# Patient Record
Sex: Male | Born: 1950 | Race: White | Hispanic: No | Marital: Married | Smoking: Never smoker
Health system: Southern US, Community
[De-identification: ages and names within clinical notes are randomized; demographics above are authoritative.]

## PROBLEM LIST (undated history)

## (undated) DIAGNOSIS — Z8719 Personal history of other diseases of the digestive system: Secondary | ICD-10-CM

## (undated) DIAGNOSIS — K529 Noninfective gastroenteritis and colitis, unspecified: Secondary | ICD-10-CM

## (undated) DIAGNOSIS — C44212 Basal cell carcinoma of skin of right ear and external auricular canal: Secondary | ICD-10-CM

---

## 2000-12-31 ENCOUNTER — Other Ambulatory Visit: Admission: RE | Admit: 2000-12-31 | Discharge: 2000-12-31 | Payer: Self-pay | Admitting: Gastroenterology

## 2000-12-31 ENCOUNTER — Encounter (INDEPENDENT_AMBULATORY_CARE_PROVIDER_SITE_OTHER): Payer: Self-pay | Admitting: Specialist

## 2005-02-01 ENCOUNTER — Ambulatory Visit: Payer: Self-pay | Admitting: Gastroenterology

## 2005-02-18 ENCOUNTER — Ambulatory Visit: Payer: Self-pay | Admitting: Gastroenterology

## 2005-02-18 ENCOUNTER — Encounter (INDEPENDENT_AMBULATORY_CARE_PROVIDER_SITE_OTHER): Payer: Self-pay | Admitting: Specialist

## 2006-02-14 ENCOUNTER — Ambulatory Visit: Payer: Self-pay | Admitting: Gastroenterology

## 2006-02-24 ENCOUNTER — Ambulatory Visit: Payer: Self-pay | Admitting: Gastroenterology

## 2006-03-27 ENCOUNTER — Ambulatory Visit: Payer: Self-pay | Admitting: Gastroenterology

## 2009-10-05 ENCOUNTER — Telehealth (INDEPENDENT_AMBULATORY_CARE_PROVIDER_SITE_OTHER): Payer: Self-pay | Admitting: *Deleted

## 2010-08-14 NOTE — Progress Notes (Signed)
  Phone Note Other Incoming   Request: Send information Summary of Call: Request received from [facility] forwarded to [facility].

## 2010-11-30 NOTE — Assessment & Plan Note (Signed)
[facility]                           GASTROENTEROLOGY OFFICE NOTE   NAME:Stroope, HILERY WINTLE                       MRN:          [id number]  DATE:02/14/2006                            DOB:          08/25/1950    HISTORY:  Khayree has had some asymptomatic rectal bleeding for the last few  days without abdominal pain or other systemic complaints.  He has known  chronic ulcerative colitis with the last colonoscopy exam one year ago which  was fairly unremarkable.  Biopsy showed no evidence of dysplasia.   I am going ahead and set him up for a follow-up colonoscopy exam because of  some atypia seen on biopsy several years ago.  Restarted Canasa 1 gm  suppository at bedtime.  I have also sent him by the laboratory to check  screening laboratory parameters.                                   Vania Rea. Jarold Motto, MD, Clementeen [place], [place]   DRP/MedQ  DD:  02/14/2006  DT:  02/14/2006  Job #:  [id number]

## 2010-11-30 NOTE — Assessment & Plan Note (Signed)
[facility]                           GASTROENTEROLOGY OFFICE NOTE   NAME:Joseph David, Joseph David                       MRN:          [id number]  DATE:03/27/2006                            DOB:          Jan 09, 1951    Kyreese's colon exam on February 24, 2006, was unremarkable except for some very  mild proctitis.  He used Canasa 1000 mg suppositories for 2 weeks.  He is  now asymptomatic and does not want to be on chronic aminosalicylate  prophylactic therapy, he is only having a flare less than once a year.  I  have consulted him about the use of suppositories and perhaps the benefit of  every three day suppositories and he will consider this.  He has not had  pancolonic disease but will need colonoscopy followup again in three years'  time.                                   Vania Rea. Jarold Motto, MD, [place]een [place], [place]   DRP/MedQ  DD:  03/27/2006  DT:  03/27/2006  Job #:  [id number]

## 2019-11-25 ENCOUNTER — Other Ambulatory Visit: Payer: Self-pay

## 2019-11-25 ENCOUNTER — Observation Stay: Payer: Medicare Other

## 2019-11-25 ENCOUNTER — Observation Stay
Admission: EM | Admit: 2019-11-25 | Discharge: 2019-11-26 | Disposition: A | Discharge status: Home / Self Care | Payer: Medicare Other | Attending: Internal Medicine | Admitting: Internal Medicine

## 2019-11-25 ENCOUNTER — Emergency Department: Payer: Medicare Other

## 2019-11-25 ENCOUNTER — Encounter: Payer: Self-pay | Admitting: Emergency Medicine

## 2019-11-25 ENCOUNTER — Observation Stay: Payer: Medicare Other | Admitting: Anesthesiology

## 2019-11-25 ENCOUNTER — Encounter
Admission: EM | Disposition: A | Discharge status: Home / Self Care | Payer: Self-pay | Source: Home / Self Care | Attending: Emergency Medicine

## 2019-11-25 DIAGNOSIS — K8050 Calculus of bile duct without cholangitis or cholecystitis without obstruction: Secondary | ICD-10-CM

## 2019-11-25 DIAGNOSIS — Z20822 Contact with and (suspected) exposure to covid-19: Secondary | ICD-10-CM | POA: Insufficient documentation

## 2019-11-25 DIAGNOSIS — K8051 Calculus of bile duct without cholangitis or cholecystitis with obstruction: Secondary | ICD-10-CM | POA: Diagnosis not present

## 2019-11-25 DIAGNOSIS — R748 Abnormal levels of other serum enzymes: Secondary | ICD-10-CM | POA: Diagnosis present

## 2019-11-25 DIAGNOSIS — R079 Chest pain, unspecified: Secondary | ICD-10-CM

## 2019-11-25 DIAGNOSIS — R7401 Elevation of levels of liver transaminase levels: Secondary | ICD-10-CM | POA: Insufficient documentation

## 2019-11-25 DIAGNOSIS — R1011 Right upper quadrant pain: Secondary | ICD-10-CM | POA: Insufficient documentation

## 2019-11-25 DIAGNOSIS — K8012 Calculus of gallbladder with acute and chronic cholecystitis without obstruction: Secondary | ICD-10-CM | POA: Diagnosis not present

## 2019-11-25 DIAGNOSIS — K810 Acute cholecystitis: Secondary | ICD-10-CM | POA: Diagnosis present

## 2019-11-25 HISTORY — PX: CHOLECYSTECTOMY: SHX55

## 2019-11-25 LAB — CBC WITH DIFFERENTIAL/PLATELET
Abs Immature Granulocytes: 0.01 10*3/uL (ref 0.00–0.07)
Basophils Absolute: 0 10*3/uL (ref 0.0–0.1)
Basophils Relative: 1 %
Eosinophils Absolute: 0.1 10*3/uL (ref 0.0–0.5)
Eosinophils Relative: 1 %
HCT: 46.4 % (ref 39.0–52.0)
Hemoglobin: 16.2 g/dL (ref 13.0–17.0)
Immature Granulocytes: 0 %
Lymphocytes Relative: 21 %
Lymphs Abs: 1.5 10*3/uL (ref 0.7–4.0)
MCH: 32.9 pg (ref 26.0–34.0)
MCHC: 34.9 g/dL (ref 30.0–36.0)
MCV: 94.1 fL (ref 80.0–100.0)
Monocytes Absolute: 0.7 10*3/uL (ref 0.1–1.0)
Monocytes Relative: 9 %
Neutro Abs: 5.1 10*3/uL (ref 1.7–7.7)
Neutrophils Relative %: 68 %
Platelets: 210 10*3/uL (ref 150–400)
RBC: 4.93 MIL/uL (ref 4.22–5.81)
RDW: 12.1 % (ref 11.5–15.5)
WBC: 7.4 10*3/uL (ref 4.0–10.5)
nRBC: 0 % (ref 0.0–0.2)

## 2019-11-25 LAB — COMPREHENSIVE METABOLIC PANEL
ALT: 246 U/L — ABNORMAL HIGH (ref 0–44)
AST: 420 U/L — ABNORMAL HIGH (ref 15–41)
Albumin: 4 g/dL (ref 3.5–5.0)
Alkaline Phosphatase: 115 U/L (ref 38–126)
Anion gap: 8 (ref 5–15)
BUN: 15 mg/dL (ref 8–23)
CO2: 25 mmol/L (ref 22–32)
Calcium: 9 mg/dL (ref 8.9–10.3)
Chloride: 104 mmol/L (ref 98–111)
Creatinine, Ser: 1 mg/dL (ref 0.61–1.24)
GFR calc Af Amer: >60 mL/min (ref >60)
GFR calc non Af Amer: >60 mL/min (ref >60)
Glucose, Bld: 172 mg/dL — ABNORMAL HIGH (ref 70–99)
Potassium: 3.7 mmol/L (ref 3.5–5.1)
Sodium: 137 mmol/L (ref 135–145)
Total Bilirubin: 1.3 mg/dL — ABNORMAL HIGH (ref 0.3–1.2)
Total Protein: 6.5 g/dL (ref 6.5–8.1)

## 2019-11-25 LAB — HIV ANTIBODY (ROUTINE TESTING W REFLEX): HIV Screen 4th Generation wRfx: NONREACTIVE

## 2019-11-25 LAB — TROPONIN I (HIGH SENSITIVITY)
Troponin I (High Sensitivity): 4 ng/L (ref <18)
Troponin I (High Sensitivity): 4 ng/L (ref <18)

## 2019-11-25 LAB — SARS CORONAVIRUS 2 BY RT PCR (HOSPITAL ORDER, PERFORMED IN ~~LOC~~ HOSPITAL LAB): SARS Coronavirus 2: NEGATIVE

## 2019-11-25 LAB — LIPASE, BLOOD: Lipase: 29 U/L (ref 11–51)

## 2019-11-25 SURGERY — CHOLECYSTECTOMY, ROBOT-ASSISTED, LAPAROSCOPIC
Anesthesia: General | Site: Abdomen

## 2019-11-25 MED ORDER — BUPIVACAINE HCL (PF) 0.25 % IJ SOLN
INTRAMUSCULAR | Status: AC
  Filled 2019-11-25: qty 30

## 2019-11-25 MED ORDER — IOHEXOL 350 MG/ML SOLN
125.0000 mL | Freq: Once | INTRAVENOUS | Status: AC | PRN
  Administered 2019-11-25: 125 mL via INTRAVENOUS

## 2019-11-25 MED ORDER — SUGAMMADEX SODIUM 200 MG/2ML IV SOLN
INTRAVENOUS | Status: DC | PRN
  Administered 2019-11-25: 200 mg via INTRAVENOUS

## 2019-11-25 MED ORDER — LIDOCAINE HCL (CARDIAC) PF 100 MG/5ML IV SOSY
PREFILLED_SYRINGE | INTRAVENOUS | Status: DC | PRN
  Administered 2019-11-25: 100 mg via INTRAVENOUS

## 2019-11-25 MED ORDER — ONDANSETRON HCL 4 MG/2ML IJ SOLN: 4.0000 mg | Freq: Once | INTRAMUSCULAR | Status: DC

## 2019-11-25 MED ORDER — KETOROLAC TROMETHAMINE 15 MG/ML IJ SOLN
15.0000 mg | Freq: Four times a day (QID) | INTRAMUSCULAR | Status: DC
  Administered 2019-11-26 (×2): 15 mg via INTRAVENOUS
  Filled 2019-11-25 (×2): qty 1

## 2019-11-25 MED ORDER — SODIUM CHLORIDE 0.9 % IV SOLN
INTRAVENOUS | Status: AC
  Filled 2019-11-25: qty 2

## 2019-11-25 MED ORDER — FENTANYL CITRATE (PF) 100 MCG/2ML IJ SOLN: 25.0000 ug | INTRAMUSCULAR | Status: DC | PRN

## 2019-11-25 MED ORDER — LACTATED RINGERS IV SOLN: INTRAVENOUS | Status: DC

## 2019-11-25 MED ORDER — SODIUM CHLORIDE 0.9 % IV SOLN
INTRAVENOUS | Status: DC | PRN
  Administered 2019-11-25: 10 ug/min via INTRAVENOUS

## 2019-11-25 MED ORDER — SUCCINYLCHOLINE CHLORIDE 20 MG/ML IJ SOLN
INTRAMUSCULAR | Status: DC | PRN
  Administered 2019-11-25: 100 mg via INTRAVENOUS

## 2019-11-25 MED ORDER — OXYCODONE HCL 5 MG PO TABS
5.0000 mg | ORAL_TABLET | ORAL | Status: DC | PRN
  Administered 2019-11-25 – 2019-11-26 (×2): 5 mg via ORAL
  Filled 2019-11-25 (×2): qty 1

## 2019-11-25 MED ORDER — ONDANSETRON HCL 4 MG/2ML IJ SOLN: 4.0000 mg | Freq: Once | INTRAMUSCULAR | Status: DC | PRN

## 2019-11-25 MED ORDER — ONDANSETRON HCL 4 MG/2ML IJ SOLN
INTRAMUSCULAR | Status: DC | PRN
  Administered 2019-11-25: 4 mg via INTRAVENOUS

## 2019-11-25 MED ORDER — ONDANSETRON HCL 4 MG/2ML IJ SOLN
4.0000 mg | Freq: Four times a day (QID) | INTRAMUSCULAR | Status: DC | PRN
  Administered 2019-11-25 (×2): 4 mg via INTRAVENOUS
  Filled 2019-11-25 (×2): qty 2

## 2019-11-25 MED ORDER — FENTANYL CITRATE (PF) 100 MCG/2ML IJ SOLN
INTRAMUSCULAR | Status: AC
  Filled 2019-11-25: qty 2

## 2019-11-25 MED ORDER — FENTANYL CITRATE (PF) 100 MCG/2ML IJ SOLN: 50.0000 ug | Freq: Once | INTRAMUSCULAR | Status: AC

## 2019-11-25 MED ORDER — EPINEPHRINE PF 1 MG/ML IJ SOLN
INTRAMUSCULAR | Status: AC
  Filled 2019-11-25: qty 1

## 2019-11-25 MED ORDER — PHENYLEPHRINE HCL (PRESSORS) 10 MG/ML IV SOLN
INTRAVENOUS | Status: DC | PRN
  Administered 2019-11-25 (×3): 100 ug via INTRAVENOUS
  Administered 2019-11-25: 200 ug via INTRAVENOUS
  Administered 2019-11-25: 100 ug via INTRAVENOUS

## 2019-11-25 MED ORDER — DEXAMETHASONE SODIUM PHOSPHATE 10 MG/ML IJ SOLN
INTRAMUSCULAR | Status: DC | PRN
  Administered 2019-11-25: 10 mg via INTRAVENOUS

## 2019-11-25 MED ORDER — FENTANYL CITRATE (PF) 100 MCG/2ML IJ SOLN
INTRAMUSCULAR | Status: DC | PRN
  Administered 2019-11-25: 50 ug via INTRAVENOUS

## 2019-11-25 MED ORDER — DEXAMETHASONE SODIUM PHOSPHATE 10 MG/ML IJ SOLN
INTRAMUSCULAR | Status: AC
  Filled 2019-11-25: qty 1

## 2019-11-25 MED ORDER — ROCURONIUM BROMIDE 100 MG/10ML IV SOLN
INTRAVENOUS | Status: DC | PRN
  Administered 2019-11-25: 20 mg via INTRAVENOUS
  Administered 2019-11-25: 40 mg via INTRAVENOUS
  Administered 2019-11-25: 20 mg via INTRAVENOUS
  Administered 2019-11-25: 10 mg via INTRAVENOUS

## 2019-11-25 MED ORDER — PROPOFOL 10 MG/ML IV BOLUS
INTRAVENOUS | Status: AC
  Filled 2019-11-25: qty 20

## 2019-11-25 MED ORDER — PROPOFOL 10 MG/ML IV BOLUS
INTRAVENOUS | Status: DC | PRN
  Administered 2019-11-25: 150 mg via INTRAVENOUS

## 2019-11-25 MED ORDER — PIPERACILLIN-TAZOBACTAM 3.375 G IVPB 30 MIN
3.3750 g | Freq: Once | INTRAVENOUS | Status: AC
  Administered 2019-11-25: 3.375 g via INTRAVENOUS
  Filled 2019-11-25: qty 50

## 2019-11-25 MED ORDER — ACETAMINOPHEN 10 MG/ML IV SOLN: 1000.0000 mg | Freq: Once | INTRAVENOUS | Status: DC | PRN

## 2019-11-25 MED ORDER — OXYCODONE HCL 5 MG/5ML PO SOLN: 5.0000 mg | Freq: Once | ORAL | Status: DC | PRN

## 2019-11-25 MED ORDER — MIDAZOLAM HCL 2 MG/2ML IJ SOLN
INTRAMUSCULAR | Status: DC | PRN
  Administered 2019-11-25: 2 mg via INTRAVENOUS

## 2019-11-25 MED ORDER — INDOCYANINE GREEN 25 MG IV SOLR
7.5000 mg | Freq: Once | INTRAVENOUS | Status: AC
  Administered 2019-11-25: 7.5 mg via INTRAVENOUS

## 2019-11-25 MED ORDER — EPHEDRINE SULFATE 50 MG/ML IJ SOLN
INTRAMUSCULAR | Status: DC | PRN
  Administered 2019-11-25: 10 mg via INTRAVENOUS

## 2019-11-25 MED ORDER — EPHEDRINE 5 MG/ML INJ
INTRAVENOUS | Status: AC
  Filled 2019-11-25: qty 10

## 2019-11-25 MED ORDER — BUPIVACAINE-EPINEPHRINE (PF) 0.25% -1:200000 IJ SOLN
INTRAMUSCULAR | Status: DC | PRN
  Administered 2019-11-25: 30 mL via PERINEURAL

## 2019-11-25 MED ORDER — ONDANSETRON HCL 4 MG/2ML IJ SOLN
INTRAMUSCULAR | Status: AC
  Filled 2019-11-25: qty 2

## 2019-11-25 MED ORDER — KETOROLAC TROMETHAMINE 30 MG/ML IJ SOLN
INTRAMUSCULAR | Status: DC | PRN
  Administered 2019-11-25: 30 mg via INTRAVENOUS

## 2019-11-25 MED ORDER — ONDANSETRON HCL 4 MG/2ML IJ SOLN
INTRAMUSCULAR | Status: AC
  Administered 2019-11-25: 4 mg via INTRAVENOUS
  Filled 2019-11-25: qty 2

## 2019-11-25 MED ORDER — SODIUM CHLORIDE 0.9 % IV SOLN: 2.0000 g | INTRAVENOUS | Status: DC

## 2019-11-25 MED ORDER — LACTATED RINGERS IV BOLUS
1000.0000 mL | Freq: Once | INTRAVENOUS | Status: AC
  Administered 2019-11-25: 1000 mL via INTRAVENOUS

## 2019-11-25 MED ORDER — GADOBUTROL 1 MMOL/ML IV SOLN
10.0000 mL | Freq: Once | INTRAVENOUS | Status: AC | PRN
  Administered 2019-11-25: 10 mL via INTRAVENOUS

## 2019-11-25 MED ORDER — PIPERACILLIN-TAZOBACTAM 3.375 G IVPB: 3.3750 g | Freq: Once | INTRAVENOUS | Status: DC

## 2019-11-25 MED ORDER — FENTANYL CITRATE (PF) 100 MCG/2ML IJ SOLN
INTRAMUSCULAR | Status: AC
  Administered 2019-11-25: 50 ug via INTRAVENOUS
  Filled 2019-11-25: qty 2

## 2019-11-25 MED ORDER — SUGAMMADEX SODIUM 200 MG/2ML IV SOLN: INTRAVENOUS | Status: DC | PRN

## 2019-11-25 MED ORDER — MORPHINE SULFATE (PF) 2 MG/ML IV SOLN
2.0000 mg | INTRAVENOUS | Status: DC | PRN
  Administered 2019-11-25 (×4): 2 mg via INTRAVENOUS
  Filled 2019-11-25 (×4): qty 1

## 2019-11-25 MED ORDER — MORPHINE SULFATE (PF) 4 MG/ML IV SOLN
4.0000 mg | Freq: Once | INTRAVENOUS | Status: AC
  Administered 2019-11-25: 4 mg via INTRAVENOUS
  Filled 2019-11-25: qty 1

## 2019-11-25 MED ORDER — ONDANSETRON HCL 4 MG PO TABS: 4.0000 mg | ORAL_TABLET | Freq: Four times a day (QID) | ORAL | Status: DC | PRN

## 2019-11-25 MED ORDER — MIDAZOLAM HCL 2 MG/2ML IJ SOLN
INTRAMUSCULAR | Status: AC
  Filled 2019-11-25: qty 2

## 2019-11-25 MED ORDER — FENTANYL CITRATE (PF) 100 MCG/2ML IJ SOLN
50.0000 ug | Freq: Once | INTRAMUSCULAR | Status: AC
  Administered 2019-11-25: 50 ug via INTRAVENOUS

## 2019-11-25 MED ORDER — OXYCODONE HCL 5 MG PO TABS: 5.0000 mg | ORAL_TABLET | Freq: Once | ORAL | Status: DC | PRN

## 2019-11-25 MED ORDER — SODIUM CHLORIDE 0.9 % IV SOLN
INTRAVENOUS | Status: DC | PRN
  Administered 2019-11-25: 2 g via INTRAVENOUS

## 2019-11-25 MED ORDER — INDOCYANINE GREEN 25 MG IV SOLR: 7.5000 mg | Freq: Once | INTRAVENOUS | Status: DC

## 2019-11-25 SURGICAL SUPPLY — 52 items
BULB RESERV EVAC DRAIN JP 100C (MISCELLANEOUS) ×2 IMPLANT
CANISTER SUCT 1200ML W/VALVE (MISCELLANEOUS) ×3 IMPLANT
CANNULA REDUC XI 12-8 STAPL (CANNULA) ×2
CANNULA REDUC XI 12-8MM STAPL (CANNULA) ×1
CANNULA REDUCER 12-8 DVNC XI (CANNULA) IMPLANT
CHLORAPREP W/TINT 26 (MISCELLANEOUS) ×3 IMPLANT
CLIP VESOLOCK MED LG 6/CT (CLIP) ×3 IMPLANT
COVER WAND RF STERILE (DRAPES) ×3 IMPLANT
DECANTER SPIKE VIAL GLASS SM (MISCELLANEOUS) ×3 IMPLANT
DEFOGGER SCOPE WARMER CLEARIFY (MISCELLANEOUS) ×3 IMPLANT
DERMABOND ADVANCED (GAUZE/BANDAGES/DRESSINGS) ×2
DERMABOND ADVANCED .7 DNX12 (GAUZE/BANDAGES/DRESSINGS) ×1 IMPLANT
DRAIN CHANNEL JP 19F (MISCELLANEOUS) ×2 IMPLANT
DRAPE ARM DVNC X/XI (DISPOSABLE) ×4 IMPLANT
DRAPE COLUMN DVNC XI (DISPOSABLE) ×1 IMPLANT
DRAPE DA VINCI XI ARM (DISPOSABLE) ×12
DRAPE DA VINCI XI COLUMN (DISPOSABLE) ×3
ELECT CAUTERY BLADE 6.4 (BLADE) ×3 IMPLANT
ELECT REM PT RETURN 9FT ADLT (ELECTROSURGICAL) ×3
ELECTRODE REM PT RTRN 9FT ADLT (ELECTROSURGICAL) ×1 IMPLANT
GLOVE BIO SURGEON STRL SZ7 (GLOVE) ×6 IMPLANT
GOWN STRL REUS W/ TWL LRG LVL3 (GOWN DISPOSABLE) ×4 IMPLANT
GOWN STRL REUS W/TWL LRG LVL3 (GOWN DISPOSABLE) ×12
IRRIGATION STRYKERFLOW (MISCELLANEOUS) IMPLANT
IRRIGATOR STRYKERFLOW (MISCELLANEOUS) ×3
IV NS 1000ML (IV SOLUTION) ×3
IV NS 1000ML BAXH (IV SOLUTION) IMPLANT
KIT IMAGING PINPOINTPAQ (MISCELLANEOUS) ×2 IMPLANT
KIT PINK PAD W/HEAD ARE REST (MISCELLANEOUS) ×3
KIT PINK PAD W/HEAD ARM REST (MISCELLANEOUS) ×1 IMPLANT
LABEL OR SOLS (LABEL) ×3 IMPLANT
NEEDLE HYPO 22GX1.5 SAFETY (NEEDLE) ×3 IMPLANT
NS IRRIG 500ML POUR BTL (IV SOLUTION) ×3 IMPLANT
OBTURATOR OPTICAL STANDARD 8MM (TROCAR) ×6
OBTURATOR OPTICAL STND 8 DVNC (TROCAR) ×2
OBTURATOR OPTICALSTD 8 DVNC (TROCAR) ×1 IMPLANT
PACK LAP CHOLECYSTECTOMY (MISCELLANEOUS) ×3 IMPLANT
PENCIL ELECTRO HAND CTR (MISCELLANEOUS) ×3 IMPLANT
POUCH SPECIMEN RETRIEVAL 10MM (ENDOMECHANICALS) ×3 IMPLANT
SEAL CANN UNIV 5-8 DVNC XI (MISCELLANEOUS) ×4 IMPLANT
SEAL XI 5MM-8MM UNIVERSAL (MISCELLANEOUS) ×12
SET TUBE SMOKE EVAC HIGH FLOW (TUBING) ×3 IMPLANT
SOLUTION ELECTROLUBE (MISCELLANEOUS) ×3 IMPLANT
SPONGE LAP 18X18 RF (DISPOSABLE) ×3 IMPLANT
SPONGE LAP 4X18 RFD (DISPOSABLE) ×2 IMPLANT
STAPLER CANNULA SEAL DVNC XI (STAPLE) IMPLANT
STAPLER CANNULA SEAL XI (STAPLE) ×3
SUT ETHILON 3-0 FS-10 30 BLK (SUTURE) ×3
SUT MNCRL AB 4-0 PS2 18 (SUTURE) ×3 IMPLANT
SUT VICRYL 0 AB UR-6 (SUTURE) ×6 IMPLANT
SUTURE EHLN 3-0 FS-10 30 BLK (SUTURE) IMPLANT
TROCAR 130MM GELPORT  DAV (MISCELLANEOUS) ×3 IMPLANT

## 2019-11-25 NOTE — Anesthesia Procedure Notes (Signed)
Procedure Name: Intubation Date/Time: 11/25/2019 3:23 PM Performed by: Irving Burton, CRNA Pre-anesthesia Checklist: Patient identified, Emergency Drugs available, Suction available and Patient being monitored Patient Re-evaluated:Patient Re-evaluated prior to induction Oxygen Delivery Method: Circle system utilized Preoxygenation: Pre-oxygenation with 100% oxygen Induction Type: IV induction and Rapid sequence Laryngoscope Size: McGraph and 4 Grade View: Grade I Tube type: Oral Tube size: 7.5 mm Number of attempts: 1 Airway Equipment and Method: Stylet and Video-laryngoscopy Placement Confirmation: ETT inserted through vocal cords under direct vision,  positive ETCO2 and breath sounds checked- equal and bilateral Secured at: 24 cm Tube secured with: Tape Dental Injury: Teeth and Oropharynx as per pre-operative assessment

## 2019-11-25 NOTE — ED Notes (Signed)
This RN called and updated wife of pt's unit change. OR bedside to transport pt.

## 2019-11-25 NOTE — Progress Notes (Signed)
[facility]  - [place] at [facility]   PATIENT NAME: Joseph David    MR#:  [id number]  DATE OF BIRTH:  [birth year]  SUBJECTIVE:  patient came in with acute onset right upper quadrant pain after dinner last night. Complains of nausea. Uncomfortable this morning. Requesting pain medicine. Awaiting MRCP. No fever.  REVIEW OF SYSTEMS:   Review of Systems  Constitutional: Negative for chills, fever and weight loss.  HENT: Negative for ear discharge, ear pain and nosebleeds.   Eyes: Negative for blurred vision, pain and discharge.  Respiratory: Negative for sputum production, shortness of breath, wheezing and stridor.   Cardiovascular: Negative for chest pain, palpitations, orthopnea and PND.  Gastrointestinal: Positive for abdominal pain and nausea. Negative for diarrhea and vomiting.  Genitourinary: Negative for frequency and urgency.  Musculoskeletal: Negative for back pain and joint pain.  Neurological: Negative for sensory change, speech change, focal weakness and weakness.  Psychiatric/Behavioral: Negative for depression and hallucinations. The patient is not nervous/anxious.    Tolerating Diet:npo Tolerating PT: pt is independent  DRUG ALLERGIES:  No Known Allergies  VITALS:  Blood pressure 131/81, pulse 65, temperature 98 F (36.7 C), temperature source Oral, resp. rate 13, height 6\' 1"  (1.854 m), weight 106.6 kg, SpO2 100 %.  PHYSICAL EXAMINATION:   Physical Exam  GENERAL:  69 y.o.-year-old patient lying in the bed with no acute distress.  EYES: Pupils equal, round, reactive to light and accommodation. No scleral icterus.   HEENT: Head atraumatic, normocephalic. Oropharynx and nasopharynx clear.  NECK:  Supple, no jugular venous distention. No thyroid enlargement, no tenderness.  LUNGS: Normal breath sounds bilaterally, no wheezing, rales, rhonchi. No use of accessory muscles of respiration.  CARDIOVASCULAR: S1, S2 normal. No murmurs, rubs, or gallops.   ABDOMEN: Soft, right UQ tender, nondistended. Bowel sounds present. No organomegaly or mass.  EXTREMITIES: No cyanosis, clubbing or edema b/l.    NEUROLOGIC: Cranial nerves II through XII are intact. No focal Motor or sensory deficits b/l.   PSYCHIATRIC:  patient is alert and oriented x 3.  SKIN: No obvious rash, lesion, or ulcer.   LABORATORY PANEL:  CBC Recent Labs  Lab 11/25/19 0224  WBC 7.4  HGB 16.2  HCT 46.4  PLT 210    Chemistries  Recent Labs  Lab 11/25/19 0224  NA 137  K 3.7  CL 104  CO2 25  GLUCOSE 172*  BUN 15  CREATININE 1.00  CALCIUM 9.0  AST 420*  ALT 246*  ALKPHOS 115  BILITOT 1.3*   Cardiac Enzymes No results for input(s): TROPONINI in the last 168 hours. RADIOLOGY:  DG Chest Port 1 View  Result Date: 11/25/2019 CLINICAL DATA:  Indigestion slow breathing EXAM: PORTABLE CHEST 1 VIEW COMPARISON:  None. FINDINGS: The heart size and mediastinal contours are within normal limits. Both lungs are clear. The visualized skeletal structures are unremarkable. IMPRESSION: No active disease. Electronically Signed   By: 11/27/2019 M.D.   On: 11/25/2019 02:37   CT Angio Chest/Abd/Pel for Dissection W and/or Wo Contrast  Result Date: 11/25/2019 CLINICAL DATA:  Acute chest and upper abdominal pain with nausea. EXAM: CT ANGIOGRAPHY CHEST, ABDOMEN AND PELVIS TECHNIQUE: Non-contrast CT of the chest was initially obtained. Multidetector CT imaging through the chest, abdomen and pelvis was performed using the standard protocol during bolus administration of intravenous contrast. Multiplanar reconstructed images and MIPs were obtained and reviewed to evaluate the vascular anatomy. CONTRAST:  11/27/2019 OMNIPAQUE IOHEXOL 350 MG/ML SOLN COMPARISON:  Right upper  quadrant ultrasound mid chest x-ray from same day. FINDINGS: CTA CHEST FINDINGS Cardiovascular: Preferential opacification of the thoracic aorta. No evidence of thoracic aortic aneurysm or dissection. Normal heart size. No  pericardial effusion. No central pulmonary embolism. Mediastinum/Nodes: No enlarged mediastinal, hilar, or axillary lymph nodes. Thyroid gland, trachea, and esophagus demonstrate no significant findings. Lungs/Pleura: Lungs are clear. No pleural effusion or pneumothorax. Musculoskeletal: No chest wall abnormality. No acute or significant osseous findings. Review of the MIP images confirms the above findings. CTA ABDOMEN AND PELVIS FINDINGS VASCULAR Aorta: Normal caliber aorta without aneurysm, dissection, vasculitis or significant stenosis. Celiac: Patent without evidence of aneurysm, dissection, vasculitis or significant stenosis. SMA: Patent without evidence of aneurysm, dissection, vasculitis or significant stenosis. Renals: Both renal arteries are patent without evidence of aneurysm, dissection, vasculitis, fibromuscular dysplasia or significant stenosis. IMA: Patent without evidence of aneurysm, dissection, vasculitis or significant stenosis. Inflow: Patent without evidence of aneurysm, dissection, vasculitis or significant stenosis. Veins: No obvious venous abnormality within the limitations of this arterial phase study. Review of the MIP images confirms the above findings. NON-VASCULAR Hepatobiliary: No focal liver abnormality is seen. No gallstones or gallbladder wall thickening. Mildly dilated common bile duct measuring 9-10 mm. Pancreas: Unremarkable. No pancreatic ductal dilatation or surrounding inflammatory changes. Spleen: Normal in size without focal abnormality. Adrenals/Urinary Tract: Adrenal glands are unremarkable. Kidneys are normal, without renal calculi, focal lesion, or hydronephrosis. Bladder is unremarkable. Stomach/Bowel: Small hiatal hernia. The stomach is otherwise within normal limits. No bowel wall thickening, distention, or surrounding inflammatory changes. Normal appendix. Lymphatic: No enlarged abdominal or pelvic lymph nodes. Reproductive: Mild prostatomegaly. Other: No abdominal  wall hernia or abnormality. No abdominopelvic ascites. No pneumoperitoneum. Musculoskeletal: No acute or significant osseous findings. Review of the MIP images confirms the above findings. IMPRESSION: 1. No evidence of acute aortic syndrome or thoracoabdominal aortic aneurysm. 2. Mildly dilated common bile duct measuring 9-10 mm. Given clinical history and mildly elevated LFTs, further evaluation with ERCP or MRCP is recommended. Electronically Signed   By: Obie Dredge M.D.   On: 11/25/2019 05:12   US ABDOMEN LIMITED RUQ  Result Date: 11/25/2019 CLINICAL DATA:  Right upper quadrant pain EXAM: ULTRASOUND ABDOMEN LIMITED RIGHT UPPER QUADRANT COMPARISON:  None. FINDINGS: Gallbladder: Numerous layering calcified gallstones and sludge are seen. The largest measuring 9 mm. No gallbladder wall thickening is noted. No sonographic Murphy sign noted by sonographer. Common bile duct: Diameter: 6 mm Liver: No focal lesion identified. Within normal limits in parenchymal echogenicity. Portal vein is patent on color Doppler imaging with normal direction of blood flow towards the liver. Other: None. IMPRESSION: Cholelithiasis and layering sludge. No definite evidence of acute cholecystitis. Electronically Signed   By: Jonna Clark M.D.   On: 11/25/2019 03:40   ASSESSMENT AND PLAN:   RUPERT AZZARA is a 69 y.o. male with no significant past medical history who presents to the emergency room with right upper quadrant pain that started after having dinner last night and progressed throughout the night.  Describes the pain as severe and crampy, radiating to the back.  He tried over-the-counter medication for indigestion like Tums without relief.  It is associated with nausea but without vomiting.  Abdominal pain right upper quadrant with nausea likely due to gallstones and possible choledocholithiasis without cholecystitis -Patient presents with persistent right upper quadrant pain, with elevated LFTs, normal lipase,  mildly dilated common bile duct (on CT abdomen) -bilirubin 1.3 -MRCP ordered pending since morning - Abdominal ultrasound showing layering gallstones and  sludge. No murphy's sign --surgical consultation with Dr. Perrin Maltese appreciated. --Keep n.p.o. -Pain control with IV and po meds -Patient received an empiric dose of Zosyn in the emergency room. -no fever, wbc normal--hold abxs  Elevated transaminases suspected due to GB dz -pt drinks alcohol only socially.    DVT prophylaxis: SCDs pending procedure decision Code Status: full code  Family Communication:  none  Disposition Plan: Back to previous home environment Consults called: dr Perrin Maltese Status:obs   TOTAL TIME TAKING CARE OF THIS PATIENT: *35* minutes.  >50% time spent on counselling and coordination of care  Note: This dictation was prepared with Dragon dictation along with smaller phrase technology. Any transcriptional errors that result from this process are unintentional.  Fritzi Mandes M.D    [facility]   CC: Primary care physician; [place] Pink, MDPatient ID: Madaline Savage, male   DOB: 03-Dec-1950, 69 y.o.   MRN: [id number]

## 2019-11-25 NOTE — H&P (Signed)
History and Physical    DANNELL GORTNEY TDD:[id number] DOB: [birth year] DOA: 11/25/2019  PCP: Maryland Pink, MD   Patient coming from: Home I have personally briefly reviewed patient's old medical records in [place]  Chief Complaint: Abdominal pain times hours  HPI: Joseph David is a 69 y.o. male with no significant past medical history who presents to the emergency room with right upper quadrant pain that started after having dinner last night and progressed throughout the night.  Describes the pain as severe and crampy, radiating to the back.  N tried over-the-counter medication for indigestion like Tums without relief.  It is associated with nausea but without vomiting.  He denies chest pain or shortness of breath  ED Course: In the emergency room he was initially hypotensive at 88/48 with otherwise normal vitals.  His blood pressure improved to 119/44 with IV hydration.  WBC normal at 7400.  Elevated LFTs with AST 420 and ALT 246 and total bilirubin 1.3.  RUQ sonogram showed cholelithiasis with layering calcified gallstones sludge no definite evidence of acute cholecystitis.  Negative Murphy sign and no gallbladder wall thickening.  He subsequently had a CT angio abdomen and pelvis that showed mildly dilated common bile duct measuring 9 to 10 mm with recommendation for further evaluation with ERCP or MRCP.  Emergency room provider spoke with Dr. Alice Reichert who recommended to consult GI, Dr. Allen Norris for consideration of ERCP  Review of Systems: As per HPI otherwise 10 point review of systems negative.    History reviewed. No pertinent past medical history.  History reviewed. No pertinent surgical history.   reports that he has never smoked. He has never used smokeless tobacco. No history on file for alcohol and drug.  No Known Allergies  No pertinent past family history   Prior to Admission medications   Not on File    Physical Exam: Vitals:   11/25/19 0330 11/25/19 0400  11/25/19 0515 11/25/19 0530  BP: (!) 142/86 (!) 134/92  (!) 119/44  Pulse: (!) 57 (!) 53 62   Resp: 14 19 17 15   Temp:      TempSrc:      SpO2: 100% 97% 94%   Weight:      Height:         Vitals:   11/25/19 0330 11/25/19 0400 11/25/19 0515 11/25/19 0530  BP: (!) 142/86 (!) 134/92  (!) 119/44  Pulse: (!) 57 (!) 53 62   Resp: 14 19 17 15   Temp:      TempSrc:      SpO2: 100% 97% 94%   Weight:      Height:        Constitutional: Alert and awake, oriented x3, not in any acute distress. Eyes: PERLA, EOMI, irises appear normal, anicteric sclera,  ENMT: external ears and nose appear normal, normal hearing             Lips appears normal, oropharynx mucosa, tongue, posterior pharynx appear normal  Neck: neck appears normal, no masses, normal ROM, no thyromegaly, no JVD  CVS: S1-S2 clear, no murmur rubs or gallops,  , no carotid bruits, pedal pulses palpable, No LE edema Respiratory:  clear to auscultation bilaterally, no wheezing, rales or rhonchi. Respiratory effort normal. No accessory muscle use.  Abdomen: Tender in right upper quadrant, nondistended, normal bowel sounds, no hepatosplenomegaly, no hernias Musculoskeletal: : no cyanosis, clubbing , no contractures or atrophy Neuro: Cranial nerves II-XII intact, sensation, reflexes normal, strength Psych: judgement and insight  appear normal, stable mood and affect,  Skin: no rashes or lesions or ulcers, no induration or nodules   Labs on Admission: I have personally reviewed following labs and imaging studies  CBC: Recent Labs  Lab 11/25/19 0224  WBC 7.4  NEUTROABS 5.1  HGB 16.2  HCT 46.4  MCV 94.1  PLT 210   Basic Metabolic Panel: Recent Labs  Lab 11/25/19 0224  NA 137  K 3.7  CL 104  CO2 25  GLUCOSE 172*  BUN 15  CREATININE 1.00  CALCIUM 9.0   GFR: Estimated Creatinine Clearance: 91.9 mL/min (by C-G formula based on SCr of 1 mg/dL). Liver Function Tests: Recent Labs  Lab 11/25/19 0224  AST 420*  ALT  246*  ALKPHOS 115  BILITOT 1.3*  PROT 6.5  ALBUMIN 4.0   Recent Labs  Lab 11/25/19 0224  LIPASE 29   No results for input(s): AMMONIA in the last 168 hours. Coagulation Profile: No results for input(s): INR, PROTIME in the last 168 hours. Cardiac Enzymes: No results for input(s): CKTOTAL, CKMB, CKMBINDEX, TROPONINI in the last 168 hours. BNP (last 3 results) No results for input(s): PROBNP in the last 8760 hours. HbA1C: No results for input(s): HGBA1C in the last 72 hours. CBG: No results for input(s): GLUCAP in the last 168 hours. Lipid Profile: No results for input(s): CHOL, HDL, LDLCALC, TRIG, CHOLHDL, LDLDIRECT in the last 72 hours. Thyroid Function Tests: No results for input(s): TSH, T4TOTAL, FREET4, T3FREE, THYROIDAB in the last 72 hours. Anemia Panel: No results for input(s): VITAMINB12, FOLATE, FERRITIN, TIBC, IRON, RETICCTPCT in the last 72 hours. Urine analysis: No results found for: COLORURINE, APPEARANCEUR, LABSPEC, PHURINE, GLUCOSEU, HGBUR, BILIRUBINUR, KETONESUR, PROTEINUR, UROBILINOGEN, NITRITE, LEUKOCYTESUR  Radiological Exams on Admission: DG Chest Port 1 View  Result Date: 11/25/2019 CLINICAL DATA:  Indigestion slow breathing EXAM: PORTABLE CHEST 1 VIEW COMPARISON:  None. FINDINGS: The heart size and mediastinal contours are within normal limits. Both lungs are clear. The visualized skeletal structures are unremarkable. IMPRESSION: No active disease. Electronically Signed   By: Jonna Clark M.D.   On: 11/25/2019 02:37   CT Angio Chest/Abd/Pel for Dissection W and/or Wo Contrast  Result Date: 11/25/2019 CLINICAL DATA:  Acute chest and upper abdominal pain with nausea. EXAM: CT ANGIOGRAPHY CHEST, ABDOMEN AND PELVIS TECHNIQUE: Non-contrast CT of the chest was initially obtained. Multidetector CT imaging through the chest, abdomen and pelvis was performed using the standard protocol during bolus administration of intravenous contrast. Multiplanar reconstructed  images and MIPs were obtained and reviewed to evaluate the vascular anatomy. CONTRAST:  OMNIPAQUE IOHEXOL 350 MG/ML SOLN COMPARISON:  Right upper quadrant ultrasound mid chest x-ray from same day. FINDINGS: CTA CHEST FINDINGS Cardiovascular: Preferential opacification of the thoracic aorta. No evidence of thoracic aortic aneurysm or dissection. Normal heart size. No pericardial effusion. No central pulmonary embolism. Mediastinum/Nodes: No enlarged mediastinal, hilar, or axillary lymph nodes. Thyroid gland, trachea, and esophagus demonstrate no significant findings. Lungs/Pleura: Lungs are clear. No pleural effusion or pneumothorax. Musculoskeletal: No chest wall abnormality. No acute or significant osseous findings. Review of the MIP images confirms the above findings. CTA ABDOMEN AND PELVIS FINDINGS VASCULAR Aorta: Normal caliber aorta without aneurysm, dissection, vasculitis or significant stenosis. Celiac: Patent without evidence of aneurysm, dissection, vasculitis or significant stenosis. SMA: Patent without evidence of aneurysm, dissection, vasculitis or significant stenosis. Renals: Both renal arteries are patent without evidence of aneurysm, dissection, vasculitis, fibromuscular dysplasia or significant stenosis. IMA: Patent without evidence of aneurysm, dissection, vasculitis or  significant stenosis. Inflow: Patent without evidence of aneurysm, dissection, vasculitis or significant stenosis. Veins: No obvious venous abnormality within the limitations of this arterial phase study. Review of the MIP images confirms the above findings. NON-VASCULAR Hepatobiliary: No focal liver abnormality is seen. No gallstones or gallbladder wall thickening. Mildly dilated common bile duct measuring 9-10 mm. Pancreas: Unremarkable. No pancreatic ductal dilatation or surrounding inflammatory changes. Spleen: Normal in size without focal abnormality. Adrenals/Urinary Tract: Adrenal glands are unremarkable. Kidneys are  normal, without renal calculi, focal lesion, or hydronephrosis. Bladder is unremarkable. Stomach/Bowel: Small hiatal hernia. The stomach is otherwise within normal limits. No bowel wall thickening, distention, or surrounding inflammatory changes. Normal appendix. Lymphatic: No enlarged abdominal or pelvic lymph nodes. Reproductive: Mild prostatomegaly. Other: No abdominal wall hernia or abnormality. No abdominopelvic ascites. No pneumoperitoneum. Musculoskeletal: No acute or significant osseous findings. Review of the MIP images confirms the above findings. IMPRESSION: 1. No evidence of acute aortic syndrome or thoracoabdominal aortic aneurysm. 2. Mildly dilated common bile duct measuring 9-10 mm. Given clinical history and mildly elevated LFTs, further evaluation with ERCP or MRCP is recommended. Electronically Signed   By: Obie Dredge M.D.   On: 11/25/2019 05:12   US ABDOMEN LIMITED RUQ  Result Date: 11/25/2019 CLINICAL DATA:  Right upper quadrant pain EXAM: ULTRASOUND ABDOMEN LIMITED RIGHT UPPER QUADRANT COMPARISON:  None. FINDINGS: Gallbladder: Numerous layering calcified gallstones and sludge are seen. The largest measuring 9 mm. No gallbladder wall thickening is noted. No sonographic Murphy sign noted by sonographer. Common bile duct: Diameter: 6 mm Liver: No focal lesion identified. Within normal limits in parenchymal echogenicity. Portal vein is patent on color Doppler imaging with normal direction of blood flow towards the liver. Other: None. IMPRESSION: Cholelithiasis and layering sludge. No definite evidence of acute cholecystitis. Electronically Signed   By: Jonna Clark M.D.   On: 11/25/2019 03:40    EKG: Independently reviewed.   Assessment/Plan Principal Problem:    Choledocholithiasis without cholecystitis -Patient presents with persistent right upper quadrant pain, with elevated LFTs, normal lipase, mildly dilated common bile duct with recommendation for ERCP or MRCP -.  Abdominal  ultrasound showing layering gallstones and sludge. -Dr Servando Snare consulted for consideration of ERCP on the advice of Dr. Norma Fredrickson -Keep n.p.o. -Pain control -Patient received an empiric dose of Zosyn in the emergency room.    DVT prophylaxis: SCDs pending procedure decision Code Status: full code  Family Communication:  none  Disposition Plan: Back to previous home environment Consults called: Dr. Servando Snare Status:obs    Andris Baumann MD [facility]     11/25/2019, 5:51 AM

## 2019-11-25 NOTE — ED Notes (Signed)
Pt visualized resting, pain decreased after morphine administration. Urinal emptied orf , amber colored urine.

## 2019-11-25 NOTE — Anesthesia Preprocedure Evaluation (Signed)
Anesthesia Evaluation  Patient identified by MRN, date of birth, ID band Patient awake    Reviewed: Allergy & Precautions, NPO status , Patient's Chart, lab work & pertinent test results  History of Anesthesia Complications Negative for: history of anesthetic complications  Airway Mallampati: II  TM Distance: >3 FB Neck ROM: Full    Dental no notable dental hx. (+) Teeth Intact, Dental Advisory Given   Pulmonary neg pulmonary ROS, neg sleep apnea, neg COPD, Patient abstained from smoking.Not current smoker,    Pulmonary exam normal breath sounds clear to auscultation       Cardiovascular Exercise Tolerance: Good METS(-) hypertension(-) CAD and (-) Past MI negative cardio ROS  (-) dysrhythmias  Rhythm:Regular Rate:Normal - Systolic murmurs    Neuro/Psych negative neurological ROS  negative psych ROS   GI/Hepatic neg GERD  ,(+)     (-) substance abuse  ,   Endo/Other  neg diabetes  Renal/GU negative Renal ROS     Musculoskeletal   Abdominal   Peds  Hematology   Anesthesia Other Findings History reviewed. No pertinent past medical history.  Reproductive/Obstetrics                             Anesthesia Physical Anesthesia Plan  ASA: II  Anesthesia Plan: General   Post-op Pain Management:    Induction: Intravenous, Rapid sequence and Cricoid pressure planned  PONV Risk Score and Plan: 3 and Ondansetron and Dexamethasone  Airway Management Planned: Oral ETT  Additional Equipment: None  Intra-op Plan:   Post-operative Plan: Extubation in OR  Informed Consent: I have reviewed the patients History and Physical, chart, labs and discussed the procedure including the risks, benefits and alternatives for the proposed anesthesia with the patient or authorized representative who has indicated his/her understanding and acceptance.     Dental advisory given  Plan Discussed with:  CRNA and Surgeon  Anesthesia Plan Comments: (Patient has on and off nausea, denies vomiting or retching.   Discussed risks of anesthesia with patient, including PONV, sore throat, lip/dental damage. Rare risks discussed as well, such as cardiorespiratory and neurological sequelae. Patient understands.)        Anesthesia Quick Evaluation

## 2019-11-25 NOTE — Transfer of Care (Signed)
Immediate Anesthesia Transfer of Care Note  Patient: Joseph David  Procedure(s) Performed: XI ROBOTIC ASSISTED LAPAROSCOPIC CHOLECYSTECTOMY (N/A Abdomen)  Patient Location: PACU  Anesthesia Type:General  Level of Consciousness: sedated  Airway & Oxygen Therapy: Patient connected to face mask oxygen  Post-op Assessment: Post -op Vital signs reviewed and stable  Post vital signs: stable  Last Vitals:  Vitals Value Taken Time  BP 142/75 11/25/19 1731  Temp    Pulse 85 11/25/19 1731  Resp 15 11/25/19 1731  SpO2 100 % 11/25/19 1731  Vitals shown include unvalidated device data.  Last Pain:  Vitals:   11/25/19 1400  TempSrc: Temporal  PainSc: 6          Complications: No apparent anesthesia complications

## 2019-11-25 NOTE — ED Notes (Addendum)
MRI called per Dr. Allena Katz. MRI waiting on day shift to come screen pt prior to procedure.   3838: MRI bedside for pt screening.

## 2019-11-25 NOTE — Progress Notes (Signed)
Patient ID: Joseph David, male   DOB: 1951/02/14, 69 y.o.   MRN: [id number]  MRCP results noted. Patient is going to the OR for lap cholecystectomy by Dr. Valaria Good antibiotics are ordered by surgery.

## 2019-11-25 NOTE — ED Triage Notes (Addendum)
Pt to triage via w/c, mask in place; pt holding head down, moaning, st he is unable to even pick his arms up because he feels like he is going to pass out; pt st since last night having "indigestion" unrelieved by tums; now with pain to upper abd and lower chest accomp by nausea; pt st he feels like he is going to pass out and needs to lay down; pt is hyperventilating; encouraged to slow breathing; pt since since 1am has been feeling worse with pain and weakness

## 2019-11-25 NOTE — ED Provider Notes (Signed)
[facility] Emergency Department Provider Note  ____________________________________________  Time seen: Approximately 2:50 AM  I have reviewed the triage vital signs and the nursing notes.   HISTORY  Chief Complaint Chest Pain   HPI Joseph David is a 69 y.o. male no significant past medical history who presents for evaluation of abdominal pain.  Patient reports  having epigastric abdominal pain since last night.  The pain started after eating quiche.  Patient describes the pain as indigestion, pressure, located in the epigastric region radiating to the right side of his abdomen and up to his chest.  Has taken several Tums with no significant relief.  He has had nausea but no vomiting, no diarrhea, no constipation, he is passing flatus, no prior abdominal surgeries.  No personal or family history of heart attacks, no history of smoking, no shortness of breath or dizziness.  He reports that his pain is constant moderate to severe pain  PMH Ulcerative proctitis  Allergies Patient has no known allergies.  FH Family History  Problem Relation Age of Onset  . Stroke Maternal Grandmother  . COPD Mother  . COPD Father    Social History Social History   Tobacco Use  . Smoking status: Never Smoker  . Smokeless tobacco: Never Used  Substance Use Topics  . Alcohol use: Not on file  . Drug use: Not on file    Review of Systems  Constitutional: Negative for fever. Eyes: Negative for visual changes. ENT: Negative for sore throat. Neck: No neck pain  Cardiovascular: Negative for chest pain. Respiratory: Negative for shortness of breath. Gastrointestinal: + abdominal pain and nausea. No vomiting or diarrhea. Genitourinary: Negative for dysuria. Musculoskeletal: Negative for back pain. Skin: Negative for rash. Neurological: Negative for headaches, weakness or numbness. Psych: No SI or HI  ____________________________________________   PHYSICAL  EXAM:  VITAL SIGNS: ED Triage Vitals  Enc Vitals Group     BP 11/25/19 0200 (!) 88/48     Pulse Rate 11/25/19 0210 (!) 54     Resp 11/25/19 0210 (!) 30     Temp 11/25/19 0210 98 F (36.7 C)     Temp Source 11/25/19 0210 Oral     SpO2 11/25/19 0210 100 %     Weight 11/25/19 0158 235 lb (106.6 kg)     Height 11/25/19 0158 6' 1"  (1.854 m)     Head Circumference --      Peak Flow --      Pain Score 11/25/19 0158 9     Pain Loc --      Pain Edu? --      Excl. in [place]? --     Constitutional: Alert and oriented, looks uncomfortable due to pain, clammy.  HEENT:      Head: Normocephalic and atraumatic.         Eyes: Conjunctivae are normal. Sclera is non-icteric.       Mouth/Throat: Mucous membranes are moist.       Neck: Supple with no signs of meningismus. Cardiovascular: Regular rate and rhythm. No murmurs, gallops, or rubs. 2+ symmetrical distal pulses are present in all extremities. No JVD. Respiratory: Normal respiratory effort. Lungs are clear to auscultation bilaterally. No wheezes, crackles, or rhonchi.  Gastrointestinal: Obese, tender to palpation over the RUQ and epigastric region with no rebound or guarding, negative Murphy's sign Genitourinary: No CVA tenderness. Musculoskeletal: No edema, cyanosis, or erythema of extremities. Neurologic: Normal speech and language. Face is symmetric. Moving all extremities. No  gross focal neurologic deficits are appreciated. Skin: Skin is warm, dry and intact. No rash noted. Psychiatric: Mood and affect are normal. Speech and behavior are normal.  ____________________________________________   LABS (all labs ordered are listed, but only abnormal results are displayed)  Labs Reviewed  COMPREHENSIVE METABOLIC PANEL - Abnormal; Notable for the following components:      Result Value   Glucose, Bld 172 (*)    AST 420 (*)    ALT 246 (*)    Total Bilirubin 1.3 (*)    All other components within normal limits  SARS CORONAVIRUS 2 BY RT  PCR (HOSPITAL ORDER, [facility])  CBC WITH DIFFERENTIAL/PLATELET  LIPASE, BLOOD  TROPONIN I (HIGH SENSITIVITY)  TROPONIN I (HIGH SENSITIVITY)   ____________________________________________  EKG  ED ECG REPORT I, Rudene Re, the attending physician, personally viewed and interpreted this ECG.   02:01 AM -sinus bradycardia, first-degree AV block, rate of 54, normal QTC, normal axis, no ST elevations or depressions.  No prior EKG for comparison.  02:34 AM -sinus bradycardia, rate of 58, first-degree AV block, normal QTC, normal axis, no ST elevations or depressions. ____________________________________________  RADIOLOGY  I have personally reviewed the images performed during this visit and I agree with the Radiologist's read.   Interpretation by Radiologist:  DG Chest Port 1 View  Result Date: 11/25/2019 CLINICAL DATA:  Indigestion slow breathing EXAM: PORTABLE CHEST 1 VIEW COMPARISON:  None. FINDINGS: The heart size and mediastinal contours are within normal limits. Both lungs are clear. The visualized skeletal structures are unremarkable. IMPRESSION: No active disease. Electronically Signed   By: Prudencio Pair M.D.   On: 11/25/2019 02:37   CT Angio Chest/Abd/Pel for Dissection W and/or Wo Contrast  Result Date: 11/25/2019 CLINICAL DATA:  Acute chest and upper abdominal pain with nausea. EXAM: CT ANGIOGRAPHY CHEST, ABDOMEN AND PELVIS TECHNIQUE: Non-contrast CT of the chest was initially obtained. Multidetector CT imaging through the chest, abdomen and pelvis was performed using the standard protocol during bolus administration of intravenous contrast. Multiplanar reconstructed images and MIPs were obtained and reviewed to evaluate the vascular anatomy. CONTRAST:  126m OMNIPAQUE IOHEXOL 350 MG/ML SOLN COMPARISON:  Right upper quadrant ultrasound mid chest x-ray from same day. FINDINGS: CTA CHEST FINDINGS Cardiovascular: Preferential opacification of the  thoracic aorta. No evidence of thoracic aortic aneurysm or dissection. Normal heart size. No pericardial effusion. No central pulmonary embolism. Mediastinum/Nodes: No enlarged mediastinal, hilar, or axillary lymph nodes. Thyroid gland, trachea, and esophagus demonstrate no significant findings. Lungs/Pleura: Lungs are clear. No pleural effusion or pneumothorax. Musculoskeletal: No chest wall abnormality. No acute or significant osseous findings. Review of the MIP images confirms the above findings. CTA ABDOMEN AND PELVIS FINDINGS VASCULAR Aorta: Normal caliber aorta without aneurysm, dissection, vasculitis or significant stenosis. Celiac: Patent without evidence of aneurysm, dissection, vasculitis or significant stenosis. SMA: Patent without evidence of aneurysm, dissection, vasculitis or significant stenosis. Renals: Both renal arteries are patent without evidence of aneurysm, dissection, vasculitis, fibromuscular dysplasia or significant stenosis. IMA: Patent without evidence of aneurysm, dissection, vasculitis or significant stenosis. Inflow: Patent without evidence of aneurysm, dissection, vasculitis or significant stenosis. Veins: No obvious venous abnormality within the limitations of this arterial phase study. Review of the MIP images confirms the above findings. NON-VASCULAR Hepatobiliary: No focal liver abnormality is seen. No gallstones or gallbladder wall thickening. Mildly dilated common bile duct measuring 9-10 mm. Pancreas: Unremarkable. No pancreatic ductal dilatation or surrounding inflammatory changes. Spleen: Normal in size without focal  abnormality. Adrenals/Urinary Tract: Adrenal glands are unremarkable. Kidneys are normal, without renal calculi, focal lesion, or hydronephrosis. Bladder is unremarkable. Stomach/Bowel: Small hiatal hernia. The stomach is otherwise within normal limits. No bowel wall thickening, distention, or surrounding inflammatory changes. Normal appendix. Lymphatic: No  enlarged abdominal or pelvic lymph nodes. Reproductive: Mild prostatomegaly. Other: No abdominal wall hernia or abnormality. No abdominopelvic ascites. No pneumoperitoneum. Musculoskeletal: No acute or significant osseous findings. Review of the MIP images confirms the above findings. IMPRESSION: 1. No evidence of acute aortic syndrome or thoracoabdominal aortic aneurysm. 2. Mildly dilated common bile duct measuring 9-10 mm. Given clinical history and mildly elevated LFTs, further evaluation with ERCP or MRCP is recommended. Electronically Signed   By: Titus Dubin M.D.   On: 11/25/2019 05:12   US ABDOMEN LIMITED RUQ  Result Date: 11/25/2019 CLINICAL DATA:  Right upper quadrant pain EXAM: ULTRASOUND ABDOMEN LIMITED RIGHT UPPER QUADRANT COMPARISON:  None. FINDINGS: Gallbladder: Numerous layering calcified gallstones and sludge are seen. The largest measuring 9 mm. No gallbladder wall thickening is noted. No sonographic Murphy sign noted by sonographer. Common bile duct: Diameter: 6 mm Liver: No focal lesion identified. Within normal limits in parenchymal echogenicity. Portal vein is patent on color Doppler imaging with normal direction of blood flow towards the liver. Other: None. IMPRESSION: Cholelithiasis and layering sludge. No definite evidence of acute cholecystitis. Electronically Signed   By: Prudencio Pair M.D.   On: 11/25/2019 03:40     ____________________________________________   PROCEDURES  Procedure(s) performed:yes .1-3 Lead EKG Interpretation Performed by: Rudene Re, MD Authorized by: Rudene Re, MD     Interpretation: normal     ECG rate assessment: bradycardic     Rhythm: sinus bradycardia     Ectopy: none     Conduction: normal     Critical Care performed:  None ____________________________________________   INITIAL IMPRESSION / ASSESSMENT AND PLAN / ED COURSE  69 y.o. male no significant past medical history who presents for evaluation of epigastric  and R sided abdominal pain and nausea.  Patient looks uncomfortable due to pain, he is clammy, abdomen is obese with epigastric and right upper quadrant tenderness, no rebound or guarding.  Initial pressure was 88/48 however that improved to the low 100s without intervention.  Patient given fentanyl and Zofran for pain.  Bedside ultrasound limited due to body habitus but did show sludge and stones in the gallbladder. EKG x 2 q30 min with no evidence of ischemia.  Patient connected on telemetry for close monitoring.  Old medical records reviewed.  Differential diagnosis including cholelithiasis versus cholecystitis versus pancreatitis versus GERD versus peptic ulcer disease versus SBO versus ACS.  Plan for labs, right upper quadrant ultrasound, chest x-ray.  Will give IV fentanyl and Zofran for symptom relief.   _________________________ 5:23 AM on 11/25/2019 -----------------------------------------  Right upper quadrant ultrasound showing cholelithiasis with no evidence of cholecystitis and normal common bile duct.  Patient still complained of significant pain therefore he was sent for CT angiogram to rule out an alternative diagnosis.  CT angiogram showing no other etiology but does show a dilated common bile duct measuring 9 to 10 mm.  Patient continues to have significant pain.  His labs show elevated LFTs and mild elevated T bili with normal alk phos, normal lipase and normal white count.  Discussed with Dr. Alice Reichert from GI who recommend admission to the hospitalist service for pain control and ERCP.  No signs of ascending infection however will cover with antibiotics to prevent  that in the setting of choledocholithiasis.  Will discuss with the hospitalist for admission.    _____________________________________________ Please note:  Patient was evaluated in Emergency Department today for the symptoms described in the history of present illness. Patient was evaluated in the context of the global  COVID-19 pandemic, which necessitated consideration that the patient might be at risk for infection with the SARS-CoV-2 virus that causes COVID-19. Institutional protocols and algorithms that pertain to the evaluation of patients at risk for COVID-19 are in a state of rapid change based on information released by regulatory bodies including the CDC and federal and state organizations. These policies and algorithms were followed during the patient's care in the ED.  Some ED evaluations and interventions may be delayed as a result of limited staffing during the pandemic.   [place] Controlled Substance Database was reviewed by me. ____________________________________________   FINAL CLINICAL IMPRESSION(S) / ED DIAGNOSES   Final diagnoses:  RUQ abdominal pain  Calculus of bile duct without cholecystitis with obstruction      NEW MEDICATIONS STARTED DURING THIS VISIT:  ED Discharge Orders    None       Note:  This document was prepared using Dragon voice recognition software and may include unintentional dictation errors.    Alfred Levins, [place], MD 11/25/19 [phone]

## 2019-11-25 NOTE — Consult Note (Signed)
Cephas Darby, MD [address]  [place]  [place], [place] [address]  Main: [phone]  Fax: [phone] Pager: [phone]   Consultation  Referring Provider:     No ref. provider found Primary Care Physician:  Maryland Pink, MD Primary Gastroenterologist: Althia Forts         Reason for Consultation:     Choledocholithiasis  Date of Admission:  11/25/2019 Date of Consultation:  11/25/2019         HPI:   Joseph David is a 69 y.o. male presented to ER last night after he had acute onset of right upper quadrant pain after dinner associated with nausea.  The pain was severe and cramping, radiating to the back.  He took Tums with no relief.  In the ER, he was hypotensive which improved with IV fluids.  No leukocytosis.  Elevated LFTs with AST 420 and ALT 246 and total bilirubin 1.3.  RUQ sonogram showed cholelithiasis with layering calcified gallstones sludge no definite evidence of acute cholecystitis.  Negative Murphy sign and no gallbladder wall thickening.  He subsequently had a CT angio abdomen and pelvis that showed mildly dilated common bile duct measuring 9 to 10 mm with recommendation for further evaluation with ERCP or MRCP.    Therefore, GI is consulted for further evaluation  MRCP revealed acute cholecystitis and dilated CBD, with no evidence of obvious choledocholithiasis Patient is not seen, patient went to the OR for cholecystectomy and possible cholangiogram  NSAIDs: None  Antiplts/Anticoagulants/Anti thrombotics: None  GI Procedures:  Colonoscopy 02/18/2005 COLON, RANDOM BIOPSIES:  - ONE FRAGMENT OF COLONIC MUCOSA WITH MILD CHRONIC ACTIVE  MUCOSAL COLITIS  CONSISTENT WITH ULCERATIVE COLITIS. NO DYSPLASIA  IDENTIFIED.  - MULTIPLE FRAGMENTS OF BENIGN COLONIC MUCOSA  WITH NO EVIDENCE OF ACTIVE  MUCOSAL INFLAMMATION OR DYSPLASIA.   History reviewed. No pertinent past medical history.  History reviewed. No pertinent surgical  history.   Current Facility-Administered Medications:  .  lactated ringers infusion, , Intravenous, Continuous, Arita Miss, MD, Last Rate: 75 mL/hr at 11/25/19 1407, New Bag at 11/25/19 1407 .  [MAR Hold] morphine 2 MG/ML injection 2 mg, 2 mg, Intravenous, Q2H PRN, Athena Masse, MD, 2 mg at 11/25/19 1147 .  [MAR Hold] ondansetron (ZOFRAN) tablet 4 mg, 4 mg, Oral, Q6H PRN **OR** [MAR Hold] ondansetron (ZOFRAN) injection 4 mg, 4 mg, Intravenous, Q6H PRN, Athena Masse, MD, 4 mg at 11/25/19 0759 .  piperacillin-tazobactam (ZOSYN) IVPB 3.375 g, 3.375 g, Intravenous, Once, Edison Simon R, PA-C .  sodium chloride 0.9 % with cefoTEtan (CEFOTAN) ADS Med, , , ,    History reviewed. No pertinent family history.   Social History   Tobacco Use  . Smoking status: Never Smoker  . Smokeless tobacco: Never Used  Substance Use Topics  . Alcohol use: Yes    Comment: occas  . Drug use: Never    Allergies as of 11/25/2019  . (No Known Allergies)    Review of Systems:    All systems reviewed and negative except where noted in HPI.   Physical Exam:  Vital signs in last 24 hours: Temp:  [97.5 F (36.4 C)-98 F (36.7 C)] 97.5 F (36.4 C) (05/13 1400) Pulse Rate:  [53-84] 84 (05/13 1400) Resp:  [0-30] 14 (05/13 1400) BP: (88-144)/(44-114) 132/80 (05/13 1400) SpO2:  [94 %-100 %] 98 % (05/13 1400) Weight:  [106.6 kg] 106.6 kg (05/13 0158)    LAB RESULTS: CBC Latest Ref Rng & Units  11/25/2019  WBC 4.0 - 10.5 K/uL 7.4  Hemoglobin 13.0 - 17.0 g/dL 16.2  Hematocrit 39.0 - 52.0 % 46.4  Platelets 150 - 400 K/uL 210    BMET BMP Latest Ref Rng & Units 11/25/2019  Glucose 70 - 99 mg/dL 172(H)  BUN 8 - 23 mg/dL 15  Creatinine 0.61 - 1.24 mg/dL 1.00  Sodium 135 - 145 mmol/L 137  Potassium 3.5 - 5.1 mmol/L 3.7  Chloride 98 - 111 mmol/L 104  CO2 22 - 32 mmol/L 25  Calcium 8.9 - 10.3 mg/dL 9.0    LFT Hepatic Function Latest Ref Rng & Units 11/25/2019  Total Protein 6.5 - 8.1 g/dL 6.5   Albumin 3.5 - 5.0 g/dL 4.0  AST 15 - 41 U/L 420(H)  ALT 0 - 44 U/L 246(H)  Alk Phosphatase 38 - 126 U/L 115  Total Bilirubin 0.3 - 1.2 mg/dL 1.3(H)     STUDIES: MR 3D Recon At Scanner  Result Date: 11/25/2019 CLINICAL DATA:  Epigastric abdominal pain beginning night. Biliary ductal dilatation on recent CT. EXAM: MRI ABDOMEN WITHOUT AND WITH CONTRAST (INCLUDING MRCP) TECHNIQUE: Multiplanar multisequence MR imaging of the abdomen was performed both before and after the administration of intravenous contrast. Heavily T2-weighted images of the biliary and pancreatic ducts were obtained, and three-dimensional MRCP images were rendered by post processing. CONTRAST:  82m GADAVIST GADOBUTROL 1 MMOL/ML IV SOLN COMPARISON:  CTA on 11/25/2019 FINDINGS: Lower chest: No acute findings. Hepatobiliary: No hepatic masses identified. A few tiny sub-cm hepatic cysts noted. Gallbladder is distended and contains numerous tiny gallstones. Diffuse gallbladder wall thickening and mild pericholecystic fluid noted, as well as hyperenhancement in adjacent parenchyma. These findings are consistent with acute cholecystitis. Mild diffuse biliary ductal dilatation is seen, with proximal common bile measuring 9 mm. No evidence of choledocholithiasis or biliary stricture. Pancreas:  No mass or inflammatory changes. Spleen:  Within normal limits in size and appearance. Adrenals/Urinary Tract: No masses identified. No evidence of hydronephrosis. Stomach/Bowel: Visualized portion unremarkable. Vascular/Lymphatic: No pathologically enlarged lymph nodes identified. No abdominal aortic aneurysm. Other:  None. Musculoskeletal:  No suspicious bone lesions identified. IMPRESSION: 1. Findings consistent with acute cholecystitis. 2. Mild biliary ductal dilatation with common bile duct measuring 9 mm. No radiographic evidence of choledocholithiasis or other obstructing etiology. Electronically Signed   By: JMarlaine HindM.D.   On: 11/25/2019  13:58   DG Chest Port 1 View  Result Date: 11/25/2019 CLINICAL DATA:  Indigestion slow breathing EXAM: PORTABLE CHEST 1 VIEW COMPARISON:  None. FINDINGS: The heart size and mediastinal contours are within normal limits. Both lungs are clear. The visualized skeletal structures are unremarkable. IMPRESSION: No active disease. Electronically Signed   By: BPrudencio PairM.D.   On: 11/25/2019 02:37   MR ABDOMEN MRCP W WO CONTAST  Result Date: 11/25/2019 CLINICAL DATA:  Epigastric abdominal pain beginning night. Biliary ductal dilatation on recent CT. EXAM: MRI ABDOMEN WITHOUT AND WITH CONTRAST (INCLUDING MRCP) TECHNIQUE: Multiplanar multisequence MR imaging of the abdomen was performed both before and after the administration of intravenous contrast. Heavily T2-weighted images of the biliary and pancreatic ducts were obtained, and three-dimensional MRCP images were rendered by post processing. CONTRAST:  164mGADAVIST GADOBUTROL 1 MMOL/ML IV SOLN COMPARISON:  CTA on 11/25/2019 FINDINGS: Lower chest: No acute findings. Hepatobiliary: No hepatic masses identified. A few tiny sub-cm hepatic cysts noted. Gallbladder is distended and contains numerous tiny gallstones. Diffuse gallbladder wall thickening and mild pericholecystic fluid noted, as well as hyperenhancement in adjacent  parenchyma. These findings are consistent with acute cholecystitis. Mild diffuse biliary ductal dilatation is seen, with proximal common bile measuring 9 mm. No evidence of choledocholithiasis or biliary stricture. Pancreas:  No mass or inflammatory changes. Spleen:  Within normal limits in size and appearance. Adrenals/Urinary Tract: No masses identified. No evidence of hydronephrosis. Stomach/Bowel: Visualized portion unremarkable. Vascular/Lymphatic: No pathologically enlarged lymph nodes identified. No abdominal aortic aneurysm. Other:  None. Musculoskeletal:  No suspicious bone lesions identified. IMPRESSION: 1. Findings consistent with  acute cholecystitis. 2. Mild biliary ductal dilatation with common bile duct measuring 9 mm. No radiographic evidence of choledocholithiasis or other obstructing etiology. Electronically Signed   By: Marlaine Hind M.D.   On: 11/25/2019 13:58   CT Angio Chest/Abd/Pel for Dissection W and/or Wo Contrast  Result Date: 11/25/2019 CLINICAL DATA:  Acute chest and upper abdominal pain with nausea. EXAM: CT ANGIOGRAPHY CHEST, ABDOMEN AND PELVIS TECHNIQUE: Non-contrast CT of the chest was initially obtained. Multidetector CT imaging through the chest, abdomen and pelvis was performed using the standard protocol during bolus administration of intravenous contrast. Multiplanar reconstructed images and MIPs were obtained and reviewed to evaluate the vascular anatomy. CONTRAST:  116m OMNIPAQUE IOHEXOL 350 MG/ML SOLN COMPARISON:  Right upper quadrant ultrasound mid chest x-ray from same day. FINDINGS: CTA CHEST FINDINGS Cardiovascular: Preferential opacification of the thoracic aorta. No evidence of thoracic aortic aneurysm or dissection. Normal heart size. No pericardial effusion. No central pulmonary embolism. Mediastinum/Nodes: No enlarged mediastinal, hilar, or axillary lymph nodes. Thyroid gland, trachea, and esophagus demonstrate no significant findings. Lungs/Pleura: Lungs are clear. No pleural effusion or pneumothorax. Musculoskeletal: No chest wall abnormality. No acute or significant osseous findings. Review of the MIP images confirms the above findings. CTA ABDOMEN AND PELVIS FINDINGS VASCULAR Aorta: Normal caliber aorta without aneurysm, dissection, vasculitis or significant stenosis. Celiac: Patent without evidence of aneurysm, dissection, vasculitis or significant stenosis. SMA: Patent without evidence of aneurysm, dissection, vasculitis or significant stenosis. Renals: Both renal arteries are patent without evidence of aneurysm, dissection, vasculitis, fibromuscular dysplasia or significant stenosis. IMA:  Patent without evidence of aneurysm, dissection, vasculitis or significant stenosis. Inflow: Patent without evidence of aneurysm, dissection, vasculitis or significant stenosis. Veins: No obvious venous abnormality within the limitations of this arterial phase study. Review of the MIP images confirms the above findings. NON-VASCULAR Hepatobiliary: No focal liver abnormality is seen. No gallstones or gallbladder wall thickening. Mildly dilated common bile duct measuring 9-10 mm. Pancreas: Unremarkable. No pancreatic ductal dilatation or surrounding inflammatory changes. Spleen: Normal in size without focal abnormality. Adrenals/Urinary Tract: Adrenal glands are unremarkable. Kidneys are normal, without renal calculi, focal lesion, or hydronephrosis. Bladder is unremarkable. Stomach/Bowel: Small hiatal hernia. The stomach is otherwise within normal limits. No bowel wall thickening, distention, or surrounding inflammatory changes. Normal appendix. Lymphatic: No enlarged abdominal or pelvic lymph nodes. Reproductive: Mild prostatomegaly. Other: No abdominal wall hernia or abnormality. No abdominopelvic ascites. No pneumoperitoneum. Musculoskeletal: No acute or significant osseous findings. Review of the MIP images confirms the above findings. IMPRESSION: 1. No evidence of acute aortic syndrome or thoracoabdominal aortic aneurysm. 2. Mildly dilated common bile duct measuring 9-10 mm. Given clinical history and mildly elevated LFTs, further evaluation with ERCP or MRCP is recommended. Electronically Signed   By: WTitus DubinM.D.   On: 11/25/2019 05:12   [place] LIMITED RUQ  Result Date: 11/25/2019 CLINICAL DATA:  Right upper quadrant pain EXAM: ULTRASOUND ABDOMEN LIMITED RIGHT UPPER QUADRANT COMPARISON:  None. FINDINGS: Gallbladder: Numerous layering calcified gallstones and sludge  are seen. The largest measuring 9 mm. No gallbladder wall thickening is noted. No sonographic Murphy sign noted by sonographer.  Common bile duct: Diameter: 6 mm Liver: No focal lesion identified. Within normal limits in parenchymal echogenicity. Portal vein is patent on color Doppler imaging with normal direction of blood flow towards the liver. Other: None. IMPRESSION: Cholelithiasis and layering sludge. No definite evidence of acute cholecystitis. Electronically Signed   By: Prudencio Pair M.D.   On: 11/25/2019 03:40      Impression / Plan:   Joseph David is a 69 y.o. male with history of ulcerative colitis not on maintenance therapy, presented with acute onset of right upper quadrant pain triggered after a meal, radiating to the back, imaging evidence of cholelithiasis with a dilated CBD, elevated LFTs. No evidence of gallstone pancreatitis  No evidence of cholangitis Monitor daily LFTs Patient in OR for cholecystectomy today, if cholangiogram is positive, ERCP will be performed Check viral hepatitis panel  Thank you for involving me in the care of this patient.      LOS: 0 days   Sherri Sear, MD  11/25/2019, 3:24 PM   Note: This dictation was prepared with Dragon dictation along with smaller phrase technology. Any transcriptional errors that result from this process are unintentional.

## 2019-11-25 NOTE — Progress Notes (Signed)
I discussed the procedure in detail.  The patient was given Agricultural engineer.  We discussed the risks and benefits of a laparoscopic cholecystectomy and possible cholangiogram including, but not limited to bleeding, infection, injury to surrounding structures such as the intestine or liver, bile leak, retained gallstones, need to convert to an open procedure, prolonged diarrhea, blood clots such as  DVT, common bile duct injury, anesthesia risks, and possible need for additional procedures.  The likelihood of improvement in symptoms and return to the patient's normal status is good. We discussed the typical post-operative recovery course.

## 2019-11-25 NOTE — Op Note (Addendum)
Robotic assisted laparoscopic Cholecystectomy  Pre-operative Diagnosis: cholecystitis  Post-operative Diagnosis: same  Procedure:  Robotic assisted laparoscopic Cholecystectomy  Surgeon: Sterling Big, MD FACS  Anesthesia: Gen. with endotracheal tube  Findings: Severe gangrenous Cholecystitis   Estimated Blood Loss: 25cc       Specimens: Gallbladder           Complications: none   Procedure Details  The patient was seen again in the Holding Room. The benefits, complications, treatment options, and expected outcomes were discussed with the patient. The risks of bleeding, infection, recurrence of symptoms, failure to resolve symptoms, bile duct damage, bile duct leak, retained common bile duct stone, bowel injury, any of which could require further surgery and/or ERCP, stent, or papillotomy were reviewed with the patient. The likelihood of improving the patient's symptoms with return to their baseline status is good.  The patient and/or family concurred with the proposed plan, giving informed consent.  The patient was taken to Operating Room, identified  and the procedure verified as Laparoscopic Cholecystectomy.  A Time Out was held and the above information confirmed.  Prior to the induction of general anesthesia, antibiotic prophylaxis was administered. VTE prophylaxis was in place. General endotracheal anesthesia was then administered and tolerated well. After the induction, the abdomen was prepped with Chloraprep and draped in the sterile fashion. The patient was positioned in the supine position.  Cut down technique was used to enter the abdominal cavity and a Hasson trochar was placed after two vicryl stitches were anchored to the fascia. Pneumoperitoneum was then created with CO2 and tolerated well without any adverse changes in the patient's vital signs.  Three 8-mm ports were placed under direct vision. All skin incisions  were infiltrated with a local anesthetic agent before  making the incision and placing the trocars.   The patient was positioned  in reverse Trendelenburg, robot was brought to the surgical field and docked in the standard fashion.  We made sure all the instrumentation was kept indirect view at all times and that there were no collision between the arms. I scrubbed out and went to the console.  The gallbladder was identified, the fundus grasped and retracted cephalad. Adhesions were lysed bluntly.  The GB was found to be gangrenous with severe inflammation.The infundibulum was grasped and retracted laterally, exposing the peritoneum overlying the triangle of Calot. Given the severe inflammation I had to do a dome down technique and the dome of the GB was divided with cautery.  An extended critical view of the cystic duct and cystic artery was obtained.  The cystic duct was clearly identified and bluntly dissected.   Artery and duct were double clipped and divided. Using ICG cholangiography we visualize the cystic duct preventing any bile injuries.  Hemostasis was achieved with the electrocautery. Inspection of the right upper quadrant was performed. No bleeding, bile duct injury or leak, or bowel injury was noted. Given the degree of inflammation and ascites I irrigated the abdominal cavity profusely.  Robotic instruments and robotic arms were undocked in the standard fashion.  I scrubbed back in.  The gallbladder was removed and placed in an Endocatch bag. 19 Fr blake drain placed RUQ under direct vision.  Pneumoperitoneum was released.  The periumbilical port site was closed with interrumpted 0 Vicryl sutures. 4-0 subcuticular Monocryl was used to close the skin. Dermabond was  applied.  The patient was then extubated and brought to the recovery room in stable condition. Sponge, lap, and needle counts were correct at  closure and at the conclusion of the case.               Caroleen Hamman, MD, FACS

## 2019-11-25 NOTE — Consult Note (Signed)
[facility] SURGICAL CONSULTATION NOTE (initial) - cpt: 27062   HISTORY OF PRESENT ILLNESS (HPI):  69 y.o. male presented to [facility] ED overnight for evaluation of abdominal pain. Patient reports that yesterday evening he developed epigastric abdominal pain. He thought this may have been indigestion and described this as a burning pressure like sensation. Pain has been constant. This radiated to his right upper quadrant and into his chest. This onset after eating. He tried TUMS without any relief in his pain. He endorses associated nausea. No fever, chills, cough, CP, SOB, emesis, juandice, bladder changes, or bowel changes. He denied any history of similar pain. No previous abdominal surgeries. Work up in the ED revealed, no leukocytosis, AST (420) and ALT (246) elevation, mild hyperbilirubinemia to 1.3, negative troponin x2, RUQ [place] was concerning to cholelithiasis without cholecystitis, and CTA Chest/Abdomen/Pelvis showed CBD dilation to 10 mm.   Surgery is consulted by hospitalist physician Dr. Enedina Finner, MD in this context for evaluation and management of cholelithiasis in the setting of LFT elevation and hyperbilirubinemia concerning for possible choledocholithiasis.   PAST MEDICAL HISTORY (PMH):  History reviewed. No pertinent past medical history.   PAST SURGICAL HISTORY (PSH):  History reviewed. No pertinent surgical history.   MEDICATIONS:  Prior to Admission medications   Not on File     ALLERGIES:  No Known Allergies   SOCIAL HISTORY:  Social History   Socioeconomic History  . Marital status: Married    Spouse name: Not on file  . Number of children: Not on file  . Years of education: Not on file  . Highest education level: Not on file  Occupational History  . Not on file  Tobacco Use  . Smoking status: Never Smoker  . Smokeless tobacco: Never Used  Substance and Sexual Activity  . Alcohol use: Not on file  . Drug use: Not on file  . Sexual activity:  Not on file  Other Topics Concern  . Not on file  Social History Narrative  . Not on file   Social Determinants of Health   Financial Resource Strain:   . Difficulty of Paying Living Expenses:   Food Insecurity:   . Worried About Programme researcher, broadcasting/film/video in the Last Year:   . Barista in the Last Year:   Transportation Needs:   . Freight forwarder (Medical):   Marland Kitchen Lack of Transportation (Non-Medical):   Physical Activity:   . Days of Exercise per Week:   . Minutes of Exercise per Session:   Stress:   . Feeling of Stress :   Social Connections:   . Frequency of Communication with Friends and Family:   . Frequency of Social Gatherings with Friends and Family:   . Attends Religious Services:   . Active Member of Clubs or Organizations:   . Attends Banker Meetings:   Marland Kitchen Marital Status:   Intimate Partner Violence:   . Fear of Current or Ex-Partner:   . Emotionally Abused:   Marland Kitchen Physically Abused:   . Sexually Abused:      FAMILY HISTORY:  No family history on file.    REVIEW OF SYSTEMS:  Review of Systems  Constitutional: Negative for fever and weight loss.  HENT: Negative for congestion and sore throat.   Respiratory: Negative for cough and shortness of breath.   Cardiovascular: Negative for chest pain and palpitations.  Gastrointestinal: Positive for abdominal pain and nausea. Negative for constipation, diarrhea and vomiting.  All other  systems reviewed and are negative.   VITAL SIGNS:  Temp:  [98 F (36.7 C)] 98 F (36.7 C) (05/13 0210) Pulse Rate:  [53-62] 62 (05/13 0515) Resp:  [13-30] 16 (05/13 0730) BP: (88-144)/(44-92) 141/87 (05/13 0730) SpO2:  [94 %-100 %] 94 % (05/13 0515) Weight:  [106.6 kg] 106.6 kg (05/13 0158)     Height: 6\' 1"  (185.4 cm) Weight: 106.6 kg BMI (Calculated): 31.01   INTAKE/OUTPUT:  05/12 0701 - 05/13 0700 In: 1000 [IV Piggyback:1000] Out: -   PHYSICAL EXAM:  Physical Exam Vitals and nursing note reviewed.   Constitutional:      General: He is not in acute distress.    Appearance: Normal appearance. He is obese. He is not ill-appearing.  HENT:     Head: Normocephalic and atraumatic.  Eyes:     General: No scleral icterus.    Conjunctiva/sclera: Conjunctivae normal.  Cardiovascular:     Rate and Rhythm: Normal rate and regular rhythm.     Pulses: Normal pulses.     Heart sounds: No murmur.  Pulmonary:     Effort: Pulmonary effort is normal. No respiratory distress.     Breath sounds: Normal breath sounds.  Abdominal:     General: Abdomen is protuberant. There is no distension.     Palpations: Abdomen is soft.     Tenderness: There is abdominal tenderness in the right upper quadrant and epigastric area. There is no guarding or rebound. Negative signs include Murphy's sign.     Comments: Abdomen is tender in the epigastrium and RUQ however negative Murphy's Sign  Genitourinary:    Comments: Deferred Musculoskeletal:     Right lower leg: No edema.     Left lower leg: No edema.  Skin:    General: Skin is warm and dry.     Coloration: Skin is not jaundiced.     Findings: No erythema.  Neurological:     General: No focal deficit present.     Mental Status: He is alert and oriented to person, place, and time.  Psychiatric:        Mood and Affect: Mood normal.        Behavior: Behavior normal.      Labs:  CBC Latest Ref Rng & Units 11/25/2019  WBC 4.0 - 10.5 K/uL 7.4  Hemoglobin 13.0 - 17.0 g/dL 11/27/2019  Hematocrit 63.8 - 52.0 % 46.4  Platelets 150 - 400 K/uL 210   CMP Latest Ref Rng & Units 11/25/2019  Glucose 70 - 99 mg/dL 11/27/2019)  BUN 8 - 23 mg/dL 15  Creatinine 342(A - 7.68 mg/dL 1.15  Sodium 7.26 - 203 mmol/L 137  Potassium 3.5 - 5.1 mmol/L 3.7  Chloride 98 - 111 mmol/L 104  CO2 22 - 32 mmol/L 25  Calcium 8.9 - 10.3 mg/dL 9.0  Total Protein 6.5 - 8.1 g/dL 6.5  Total Bilirubin 0.3 - 1.2 mg/dL 559)  Alkaline Phos 38 - 126 U/L 115  AST 15 - 41 U/L 420(H)  ALT 0 - 44 U/L  246(H)     Imaging studies:   RUQ 7.4(B (11/25/2019) personally reviewed showing cholelithiasis with GB wall thickening or pericholecystic fluid, and radiologist report reviewed below:  IMPRESSION: Cholelithiasis and layering sludge. No definite evidence of acute Cholecystitis.   CTA Chest/Abdomen/Pelvis (11/25/2019) personally reviewed concerning for CBD dilation, and radiologist report reviewed below:  IMPRESSION: 1. No evidence of acute aortic syndrome or thoracoabdominal aortic aneurysm. 2. Mildly dilated common bile duct measuring 9-10 mm. Given  clinical history and mildly elevated LFTs, further evaluation with ERCP or MRCP is recommended.   Assessment/Plan: (ICD-10's: K82.20) 69 y.o. male with epigastric abdominal pain and nausea found to cholelithiasis without cholecystitis in the setting of hyperbilirubinemia which is concerning for possible choledocholithiasis.   - Agree with plan for MRCP +/- ERCP pending results  - He will benefit from laparoscopic cholecystectomy at some point; timing is pending MRCP results  - Remain NPO with IVF Resuscitation   - I think it is reasonable to hold off of ABx for now  - pain control prn; antiemetics prn  - monitor abdominal examiantion   - trend LFTs and Bilirubin levels   - further management per primary service; we will follow  All of the above findings and recommendations were discussed with the patient, and all of patient's questions were answered to his expressed satisfaction.  Thank you for the opportunity to participate in this patient's care.   -- Edison Simon, PA-C [facility] 11/25/2019, 8:58 AM [phone] M-F: 7am - 4pm

## 2019-11-26 DIAGNOSIS — K810 Acute cholecystitis: Secondary | ICD-10-CM | POA: Diagnosis not present

## 2019-11-26 DIAGNOSIS — R748 Abnormal levels of other serum enzymes: Secondary | ICD-10-CM | POA: Diagnosis present

## 2019-11-26 DIAGNOSIS — K8012 Calculus of gallbladder with acute and chronic cholecystitis without obstruction: Secondary | ICD-10-CM | POA: Diagnosis not present

## 2019-11-26 LAB — COMPREHENSIVE METABOLIC PANEL
ALT: 299 U/L — ABNORMAL HIGH (ref 0–44)
AST: 194 U/L — ABNORMAL HIGH (ref 15–41)
Albumin: 3.4 g/dL — ABNORMAL LOW (ref 3.5–5.0)
Alkaline Phosphatase: 101 U/L (ref 38–126)
Anion gap: 7 (ref 5–15)
BUN: 17 mg/dL (ref 8–23)
CO2: 26 mmol/L (ref 22–32)
Calcium: 8.5 mg/dL — ABNORMAL LOW (ref 8.9–10.3)
Chloride: 103 mmol/L (ref 98–111)
Creatinine, Ser: 1.07 mg/dL (ref 0.61–1.24)
GFR calc Af Amer: >60 mL/min (ref >60)
GFR calc non Af Amer: >60 mL/min (ref >60)
Glucose, Bld: 172 mg/dL — ABNORMAL HIGH (ref 70–99)
Potassium: 4.1 mmol/L (ref 3.5–5.1)
Sodium: 136 mmol/L (ref 135–145)
Total Bilirubin: 0.8 mg/dL (ref 0.3–1.2)
Total Protein: 5.8 g/dL — ABNORMAL LOW (ref 6.5–8.1)

## 2019-11-26 LAB — HEPATITIS PANEL, ACUTE
HCV Ab: NONREACTIVE
Hep A IgM: NONREACTIVE
Hep B C IgM: NONREACTIVE
Hepatitis B Surface Ag: NONREACTIVE

## 2019-11-26 MED ORDER — AMOXICILLIN-POT CLAVULANATE 875-125 MG PO TABS: 1.0000 | ORAL_TABLET | Freq: Two times a day (BID) | ORAL | 0 refills | Status: AC

## 2019-11-26 MED ORDER — OXYCODONE HCL 5 MG PO TABS: 5.0000 mg | ORAL_TABLET | Freq: Three times a day (TID) | ORAL | 0 refills | Status: DC | PRN

## 2019-11-26 MED ORDER — SODIUM CHLORIDE 0.9 % IV SOLN
2.0000 g | INTRAVENOUS | Status: DC
  Filled 2019-11-26: qty 20

## 2019-11-26 NOTE — Discharge Summary (Addendum)
Physician Discharge Summary  Joseph David QZR:[id number] DOB: 1950/09/09 DOA: 11/25/2019  PCP: Jerl Mina, MD  Admit date: 11/25/2019 Discharge date: 11/26/2019  Discharge disposition: Home   Recommendations for Outpatient Follow-Up:   Outpatient follow-up with general surgeon, Dr. Everlene Farrier.   Discharge Diagnosis:   Principal Problem:   Acute cholecystitis Active Problems:   Elevated liver enzymes    Discharge Condition: Stable.  Diet recommendation: Heart healthy diet  Code status: Full code.    Hospital Course:   Joseph David is a 69 year old man who presented to the hospital with severe, crampy right upper quadrant abdominal pain last year with nausea.  Work-up revealed elevated liver enzymes and gallstones.  He was seen in consultation by the general surgeon and gastroenterologist.  Serology for hepatitis A, B and C was nonreactive.  MRCP revealed acute cholecystitis.  He was treated with analgesics and empiric IV Rocephin.  He underwent lap cholecystectomy on 11/25/2019.  His condition has improved and from general surgeon's standpoint, he is okay for discharge.  Discharge plan was discussed with the patient and his wife at the bedside.    Medical Consultants:       Discharge Exam:   Vitals:   11/26/19 0500 11/26/19 0934  BP: (!) 109/50 (!) 93/52  Pulse: 71 66  Resp: 18 16  Temp: 97.8 F (36.6 C) 98.1 F (36.7 C)  SpO2: 97% 96%   Vitals:   11/25/19 2235 11/25/19 2339 11/26/19 0500 11/26/19 0934  BP: 120/78 (!) 125/56 (!) 109/50 (!) 93/52  Pulse: 84 80 71 66  Resp: 19 18 18 16   Temp: 98.6 F (37 C) 99.2 F (37.3 C) 97.8 F (36.6 C) 98.1 F (36.7 C)  TempSrc: Oral Oral Oral Oral  SpO2: 97% 96% 97% 96%  Weight:      Height:         GEN: NAD SKIN: No rash EYES: EOMI ENT: MMM CV: RRR PULM: CTA B ABD: soft, ND, mild surgical tenderness, +BS, +JP drain with serosanguinous fluid CNS: AAO x 3, non focal EXT: No edema or  tenderness   The results of significant diagnostics from this hospitalization (including imaging, microbiology, ancillary and laboratory) are listed below for reference.     Procedures and Diagnostic Studies:   MR 3D Recon At Scanner  Result Date: 11/25/2019 CLINICAL DATA:  Epigastric abdominal pain beginning night. Biliary ductal dilatation on recent CT. EXAM: MRI ABDOMEN WITHOUT AND WITH CONTRAST (INCLUDING MRCP) TECHNIQUE: Multiplanar multisequence MR imaging of the abdomen was performed both before and after the administration of intravenous contrast. Heavily T2-weighted images of the biliary and pancreatic ducts were obtained, and three-dimensional MRCP images were rendered by post processing. CONTRAST:  74mL GADAVIST GADOBUTROL 1 MMOL/ML IV SOLN COMPARISON:  CTA on 11/25/2019 FINDINGS: Lower chest: No acute findings. Hepatobiliary: No hepatic masses identified. A few tiny sub-cm hepatic cysts noted. Gallbladder is distended and contains numerous tiny gallstones. Diffuse gallbladder wall thickening and mild pericholecystic fluid noted, as well as hyperenhancement in adjacent parenchyma. These findings are consistent with acute cholecystitis. Mild diffuse biliary ductal dilatation is seen, with proximal common bile measuring 9 mm. No evidence of choledocholithiasis or biliary stricture. Pancreas:  No mass or inflammatory changes. Spleen:  Within normal limits in size and appearance. Adrenals/Urinary Tract: No masses identified. No evidence of hydronephrosis. Stomach/Bowel: Visualized portion unremarkable. Vascular/Lymphatic: No pathologically enlarged lymph nodes identified. No abdominal aortic aneurysm. Other:  None. Musculoskeletal:  No suspicious bone lesions identified. IMPRESSION: 1. Findings consistent  with acute cholecystitis. 2. Mild biliary ductal dilatation with common bile duct measuring 9 mm. No radiographic evidence of choledocholithiasis or other obstructing etiology. Electronically  Signed   By: Marlaine Hind M.D.   On: 11/25/2019 13:58   DG Chest Port 1 View  Result Date: 11/25/2019 CLINICAL DATA:  Indigestion slow breathing EXAM: PORTABLE CHEST 1 VIEW COMPARISON:  None. FINDINGS: The heart size and mediastinal contours are within normal limits. Both lungs are clear. The visualized skeletal structures are unremarkable. IMPRESSION: No active disease. Electronically Signed   By: Prudencio Pair M.D.   On: 11/25/2019 02:37   MR ABDOMEN MRCP W WO CONTAST  Result Date: 11/25/2019 CLINICAL DATA:  Epigastric abdominal pain beginning night. Biliary ductal dilatation on recent CT. EXAM: MRI ABDOMEN WITHOUT AND WITH CONTRAST (INCLUDING MRCP) TECHNIQUE: Multiplanar multisequence MR imaging of the abdomen was performed both before and after the administration of intravenous contrast. Heavily T2-weighted images of the biliary and pancreatic ducts were obtained, and three-dimensional MRCP images were rendered by post processing. CONTRAST:  4mL GADAVIST GADOBUTROL 1 MMOL/ML IV SOLN COMPARISON:  CTA on 11/25/2019 FINDINGS: Lower chest: No acute findings. Hepatobiliary: No hepatic masses identified. A few tiny sub-cm hepatic cysts noted. Gallbladder is distended and contains numerous tiny gallstones. Diffuse gallbladder wall thickening and mild pericholecystic fluid noted, as well as hyperenhancement in adjacent parenchyma. These findings are consistent with acute cholecystitis. Mild diffuse biliary ductal dilatation is seen, with proximal common bile measuring 9 mm. No evidence of choledocholithiasis or biliary stricture. Pancreas:  No mass or inflammatory changes. Spleen:  Within normal limits in size and appearance. Adrenals/Urinary Tract: No masses identified. No evidence of hydronephrosis. Stomach/Bowel: Visualized portion unremarkable. Vascular/Lymphatic: No pathologically enlarged lymph nodes identified. No abdominal aortic aneurysm. Other:  None. Musculoskeletal:  No suspicious bone lesions  identified. IMPRESSION: 1. Findings consistent with acute cholecystitis. 2. Mild biliary ductal dilatation with common bile duct measuring 9 mm. No radiographic evidence of choledocholithiasis or other obstructing etiology. Electronically Signed   By: Marlaine Hind M.D.   On: 11/25/2019 13:58   CT Angio Chest/Abd/Pel for Dissection W and/or Wo Contrast  Result Date: 11/25/2019 CLINICAL DATA:  Acute chest and upper abdominal pain with nausea. EXAM: CT ANGIOGRAPHY CHEST, ABDOMEN AND PELVIS TECHNIQUE: Non-contrast CT of the chest was initially obtained. Multidetector CT imaging through the chest, abdomen and pelvis was performed using the standard protocol during bolus administration of intravenous contrast. Multiplanar reconstructed images and MIPs were obtained and reviewed to evaluate the vascular anatomy. CONTRAST:  140mL OMNIPAQUE IOHEXOL 350 MG/ML SOLN COMPARISON:  Right upper quadrant ultrasound mid chest x-ray from same day. FINDINGS: CTA CHEST FINDINGS Cardiovascular: Preferential opacification of the thoracic aorta. No evidence of thoracic aortic aneurysm or dissection. Normal heart size. No pericardial effusion. No central pulmonary embolism. Mediastinum/Nodes: No enlarged mediastinal, hilar, or axillary lymph nodes. Thyroid gland, trachea, and esophagus demonstrate no significant findings. Lungs/Pleura: Lungs are clear. No pleural effusion or pneumothorax. Musculoskeletal: No chest wall abnormality. No acute or significant osseous findings. Review of the MIP images confirms the above findings. CTA ABDOMEN AND PELVIS FINDINGS VASCULAR Aorta: Normal caliber aorta without aneurysm, dissection, vasculitis or significant stenosis. Celiac: Patent without evidence of aneurysm, dissection, vasculitis or significant stenosis. SMA: Patent without evidence of aneurysm, dissection, vasculitis or significant stenosis. Renals: Both renal arteries are patent without evidence of aneurysm, dissection, vasculitis,  fibromuscular dysplasia or significant stenosis. IMA: Patent without evidence of aneurysm, dissection, vasculitis or significant stenosis. Inflow: Patent without evidence of  aneurysm, dissection, vasculitis or significant stenosis. Veins: No obvious venous abnormality within the limitations of this arterial phase study. Review of the MIP images confirms the above findings. NON-VASCULAR Hepatobiliary: No focal liver abnormality is seen. No gallstones or gallbladder wall thickening. Mildly dilated common bile duct measuring 9-10 mm. Pancreas: Unremarkable. No pancreatic ductal dilatation or surrounding inflammatory changes. Spleen: Normal in size without focal abnormality. Adrenals/Urinary Tract: Adrenal glands are unremarkable. Kidneys are normal, without renal calculi, focal lesion, or hydronephrosis. Bladder is unremarkable. Stomach/Bowel: Small hiatal hernia. The stomach is otherwise within normal limits. No bowel wall thickening, distention, or surrounding inflammatory changes. Normal appendix. Lymphatic: No enlarged abdominal or pelvic lymph nodes. Reproductive: Mild prostatomegaly. Other: No abdominal wall hernia or abnormality. No abdominopelvic ascites. No pneumoperitoneum. Musculoskeletal: No acute or significant osseous findings. Review of the MIP images confirms the above findings. IMPRESSION: 1. No evidence of acute aortic syndrome or thoracoabdominal aortic aneurysm. 2. Mildly dilated common bile duct measuring 9-10 mm. Given clinical history and mildly elevated LFTs, further evaluation with ERCP or MRCP is recommended. Electronically Signed   By: Obie Dredge M.D.   On: 11/25/2019 05:12   US ABDOMEN LIMITED RUQ  Result Date: 11/25/2019 CLINICAL DATA:  Right upper quadrant pain EXAM: ULTRASOUND ABDOMEN LIMITED RIGHT UPPER QUADRANT COMPARISON:  None. FINDINGS: Gallbladder: Numerous layering calcified gallstones and sludge are seen. The largest measuring 9 mm. No gallbladder wall thickening is  noted. No sonographic Murphy sign noted by sonographer. Common bile duct: Diameter: 6 mm Liver: No focal lesion identified. Within normal limits in parenchymal echogenicity. Portal vein is patent on color Doppler imaging with normal direction of blood flow towards the liver. Other: None. IMPRESSION: Cholelithiasis and layering sludge. No definite evidence of acute cholecystitis. Electronically Signed   By: Jonna Clark M.D.   On: 11/25/2019 03:40     Labs:   Basic Metabolic Panel: Recent Labs  Lab 11/25/19 0224 11/26/19 0330  NA 137 136  K 3.7 4.1  CL 104 103  CO2 25 26  GLUCOSE 172* 172*  BUN 15 17  CREATININE 1.00 1.07  CALCIUM 9.0 8.5*   GFR Estimated Creatinine Clearance: 85.8 mL/min (by C-G formula based on SCr of 1.07 mg/dL). Liver Function Tests: Recent Labs  Lab 11/25/19 0224 11/26/19 0330  AST 420* 194*  ALT 246* 299*  ALKPHOS 115 101  BILITOT 1.3* 0.8  PROT 6.5 5.8*  ALBUMIN 4.0 3.4*   Recent Labs  Lab 11/25/19 0224  LIPASE 29   No results for input(s): AMMONIA in the last 168 hours. Coagulation profile No results for input(s): INR, PROTIME in the last 168 hours.  CBC: Recent Labs  Lab 11/25/19 0224  WBC 7.4  NEUTROABS 5.1  HGB 16.2  HCT 46.4  MCV 94.1  PLT 210   Cardiac Enzymes: No results for input(s): CKTOTAL, CKMB, CKMBINDEX, TROPONINI in the last 168 hours. BNP: Invalid input(s): POCBNP CBG: No results for input(s): GLUCAP in the last 168 hours. D-Dimer No results for input(s): DDIMER in the last 72 hours. Hgb A1c No results for input(s): HGBA1C in the last 72 hours. Lipid Profile No results for input(s): CHOL, HDL, LDLCALC, TRIG, CHOLHDL, LDLDIRECT in the last 72 hours. Thyroid function studies No results for input(s): TSH, T4TOTAL, T3FREE, THYROIDAB in the last 72 hours.  Invalid input(s): FREET3 Anemia work up No results for input(s): VITAMINB12, FOLATE, FERRITIN, TIBC, IRON, RETICCTPCT in the last 72  hours. Microbiology Recent Results (from the past 240 hour(s))  SARS Coronavirus  2 by RT PCR (hospital order, performed in [facility] hospital lab) Nasopharyngeal Nasopharyngeal Swab     Status: None   Collection Time: 11/25/19  5:29 AM   Specimen: Nasopharyngeal Swab  Result Value Ref Range Status   SARS Coronavirus 2 NEGATIVE NEGATIVE Final    Comment: (NOTE) SARS-CoV-2 target nucleic acids are NOT DETECTED. The SARS-CoV-2 RNA is generally detectable in upper and lower respiratory specimens during the acute phase of infection. The lowest concentration of SARS-CoV-2 viral copies this assay can detect is 250 copies / mL. A negative result does not preclude SARS-CoV-2 infection and should not be used as the sole basis for treatment or other patient management decisions.  A negative result may occur with improper specimen collection / handling, submission of specimen other than nasopharyngeal swab, presence of viral mutation(s) within the areas targeted by this assay, and inadequate number of viral copies (<250 copies / mL). A negative result must be combined with clinical observations, patient history, and epidemiological information. Fact Sheet for Patients:   BoilerBrush.com.cy Fact Sheet for Healthcare Providers: https://pope.com/ This test is not yet approved or cleared  by the [place] FDA and has been authorized for detection and/or diagnosis of SARS-CoV-2 by FDA under an Emergency Use Authorization (EUA).  This EUA will remain in effect (meaning this test can be used) for the duration of the COVID-19 declaration under Section 564(b)(1) of the Act, 21 U.S.C. section 360bbb-3(b)(1), unless the authorization is terminated or revoked sooner. Performed at [facility], [address], [place], [place] [address]      Discharge Instructions:   Discharge Instructions    Diet - low sodium heart healthy   Complete by:  As directed    Increase activity slowly   Complete by: As directed      Allergies as of 11/26/2019   No Known Allergies     Medication List    TAKE these medications   amoxicillin-clavulanate 875-125 MG tablet Commonly known as: Augmentin Take 1 tablet by mouth 2 (two) times daily for 6 days.   oxyCODONE 5 MG immediate release tablet Commonly known as: Oxy IR/ROXICODONE Take 1 tablet (5 mg total) by mouth every 8 (eight) hours as needed for moderate pain.      Follow-up Information    Leafy Ro, MD. Go on 12/01/2019.   Specialty: General Surgery Why: 11:15am appointment Contact information: [address] [place] [address] [phone]            Time coordinating discharge: 28 minutes  Signed:  Lurene Shadow  [facility] 11/26/2019, 9:33 PM

## 2019-11-26 NOTE — Care Management Obs Status (Signed)
MEDICARE OBSERVATION STATUS NOTIFICATION   Patient Details  Name: Joseph David MRN: [id number] Date of Birth: 1951-03-16   Medicare Observation Status Notification Given:  Yes    Margarito Liner, LCSW 11/26/2019, 10:02 AM

## 2019-11-26 NOTE — Plan of Care (Addendum)
Patient educated on how to care for surgical drain at home for discharge.

## 2019-11-26 NOTE — Progress Notes (Signed)
Para Skeans to be D/C'd Home per MD order.  Discussed prescriptions and follow up appointments with the patient. Prescriptions given to patient, medication list explained in detail. Pt verbalized understanding.  Allergies as of 11/26/2019   No Known Allergies     Medication List    TAKE these medications   amoxicillin-clavulanate 875-125 MG tablet Commonly known as: Augmentin Take 1 tablet by mouth 2 (two) times daily for 6 days.   oxyCODONE 5 MG immediate release tablet Commonly known as: Oxy IR/ROXICODONE Take 1 tablet (5 mg total) by mouth every 8 (eight) hours as needed for moderate pain.       Vitals:   11/26/19 0500 11/26/19 0934  BP: (!) 109/50 (!) 93/52  Pulse: 71 66  Resp: 18 16  Temp: 97.8 F (36.6 C) 98.1 F (36.7 C)  SpO2: 97% 96%    Skin clean, dry and intact without evidence of skin break down, no evidence of skin tears noted. IV catheter discontinued intact. Site without signs and symptoms of complications. Dressing and pressure applied. Pt denies pain at this time. No complaints noted.  An After Visit Summary was printed and given to the patient. Patient escorted via WC, and D/C home via private auto.  Madie Reno, RN

## 2019-11-26 NOTE — Progress Notes (Signed)
POD # 1 Doing very well AVSS JP 45 cc Minimal pain  PE NAD Abd: incisions c/d/i, no peritonitis, JP serosanguinous fluid  A/P Doing well May DC home w 7 day a/bs and JP F/U w me next week D/w Hospitalist in detail

## 2019-11-29 LAB — SURGICAL PATHOLOGY

## 2019-11-29 NOTE — Anesthesia Postprocedure Evaluation (Signed)
Anesthesia Post Note  Patient: Joseph David  Procedure(s) Performed: XI ROBOTIC ASSISTED LAPAROSCOPIC CHOLECYSTECTOMY (N/A Abdomen)  Patient location during evaluation: PACU Anesthesia Type: General Level of consciousness: awake and alert Pain management: pain level controlled Vital Signs Assessment: post-procedure vital signs reviewed and stable Respiratory status: spontaneous breathing, nonlabored ventilation and respiratory function stable Cardiovascular status: blood pressure returned to baseline and stable Postop Assessment: no apparent nausea or vomiting Anesthetic complications: no     Last Vitals:  Vitals:   11/26/19 0500 11/26/19 0934  BP: (!) 109/50 (!) 93/52  Pulse: 71 66  Resp: 18 16  Temp: 36.6 C 36.7 C  SpO2: 97% 96%    Last Pain:  Vitals:   11/26/19 0934  TempSrc: Oral  PainSc:                  Karleen Hampshire

## 2019-12-01 ENCOUNTER — Other Ambulatory Visit: Payer: Self-pay

## 2019-12-01 ENCOUNTER — Encounter: Payer: Self-pay | Admitting: Surgery

## 2019-12-01 ENCOUNTER — Ambulatory Visit (INDEPENDENT_AMBULATORY_CARE_PROVIDER_SITE_OTHER): Payer: Self-pay | Admitting: Surgery

## 2019-12-01 VITALS — BP 132/81 | HR 62 | Temp 97.9°F | Ht 72.0 in | Wt 252.6 lb

## 2019-12-01 DIAGNOSIS — Z09 Encounter for follow-up examination after completed treatment for conditions other than malignant neoplasm: Secondary | ICD-10-CM

## 2019-12-01 NOTE — Progress Notes (Signed)
S/p robotic chole Path d/w pt Doing well No fevers or chills JP 20-30cc serous + PO  PE NAD Abd: soft, nt, incisions c./d/i, jp serous removed. No infection  A/ p Doing well w/o complications RTC prn

## 2019-12-01 NOTE — Patient Instructions (Addendum)
We have removed your drain today. You may remove the dressing tomorrow.  You may use Benadryl at night to help with sleeping. You may use Ibuprofen 600mg  every 8 hours and you may also use Tylenol 1000mg  every 8 hours.  You may use ice to the area as needed for comfort.                                                                                                         Follow-up with our office as needed.  Please call and ask to speak with a nurse if you develop questions or concerns.   GENERAL POST-OPERATIVE PATIENT INSTRUCTIONS   WOUND CARE INSTRUCTIONS:  Keep a dry clean dressing on the wound if there is drainage. The initial bandage may be removed after 24 hours.  Once the wound has quit draining you may leave it open to air.  If clothing rubs against the wound or causes irritation and the wound is not draining you may cover it with a dry dressing during the daytime.  Try to keep the wound dry and avoid ointments on the wound unless directed to do so.  If the wound becomes bright red and painful or starts to drain infected material that is not clear, please contact your physician immediately.  If the wound is mildly pink and has a thick firm ridge underneath it, this is normal, and is referred to as a healing ridge.  This will resolve over the next 4-6 weeks.  BATHING: You may shower if you have been informed of this by your surgeon. However, Please do not submerge in a tub, hot tub, or pool until incisions are completely sealed or have been told by your surgeon that you may do so.  DIET:  You may eat any foods that you can tolerate.  It is a good idea to eat a high fiber diet and take in plenty of fluids to prevent constipation.  If you do become constipated you may want to take a mild laxative or take ducolax tablets on a daily basis until your bowel habits are regular.  Constipation can be very uncomfortable, along with straining, after recent surgery.  ACTIVITY:  You are encouraged to  cough and deep breath or use your incentive spirometer if you were given one, every 15-30 minutes when awake.  This will help prevent respiratory complications and low grade fevers post-operatively if you had a general anesthetic.  You may want to hug a pillow when coughing and sneezing to add additional support to the surgical area, if you had abdominal or chest surgery, which will decrease pain during these times.  You are encouraged to walk and engage in light activity for the next two weeks.  You should not lift more than 20 pounds for 6 weeks after surgery as it could put you at increased risk for complications.  Twenty pounds is roughly equivalent to a plastic bag of groceries. At that time- Listen to your body when lifting, if you have pain when lifting, stop and then try again in a  few days. Soreness after doing exercises or activities of daily living is normal as you get back in to your normal routine.  MEDICATIONS:  Try to take narcotic medications and anti-inflammatory medications, such as tylenol, ibuprofen, naprosyn, etc., with food.  This will minimize stomach upset from the medication.  Should you develop nausea and vomiting from the pain medication, or develop a rash, please discontinue the medication and contact your physician.  You should not drive, make important decisions, or operate machinery when taking narcotic pain medication.  SUNBLOCK Use sun block to incision area over the next year if this area will be exposed to sun. This helps decrease scarring and will allow you avoid a permanent darkened area over your incision.  QUESTIONS:  Please feel free to call our office if you have any questions, and we will be glad to assist you.

## 2020-05-01 ENCOUNTER — Ambulatory Visit (LOCAL_COMMUNITY_HEALTH_CENTER): Payer: Self-pay

## 2020-05-01 ENCOUNTER — Other Ambulatory Visit: Payer: Self-pay

## 2020-05-01 DIAGNOSIS — Z23 Encounter for immunization: Secondary | ICD-10-CM

## 2021-12-06 IMAGING — US US ABDOMEN LIMITED
1 series · 14 of 25 positions shown · non-contrast
Comparison: None.

CLINICAL DATA: Right upper quadrant pain

EXAM:
ULTRASOUND ABDOMEN LIMITED RIGHT UPPER QUADRANT

[Series 1: us abdomen limited ruq · 14 of 41 slices shown]
[im 1/41]
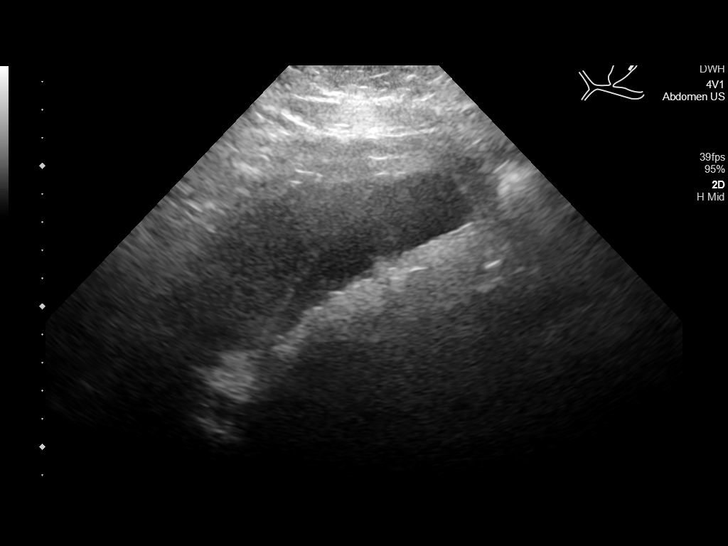
[im 4/41]
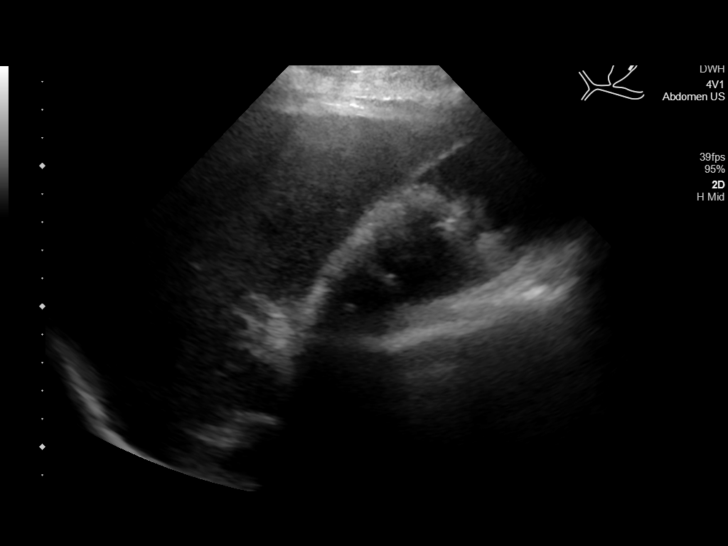
[im 7/41]
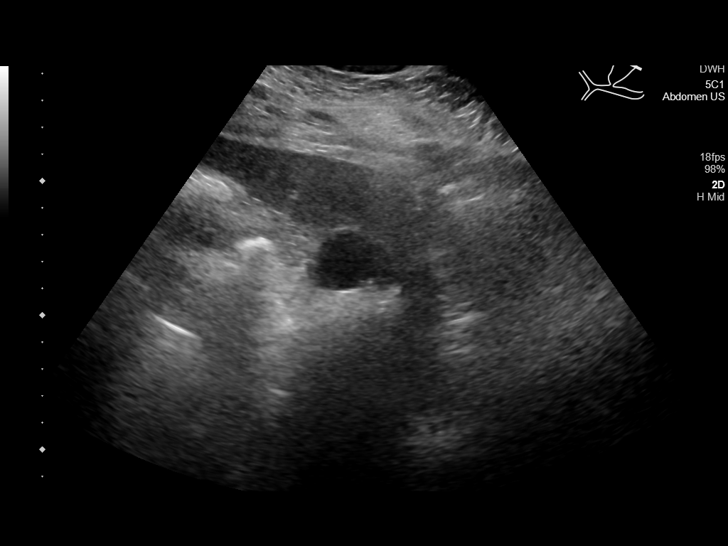
[im 11/41]
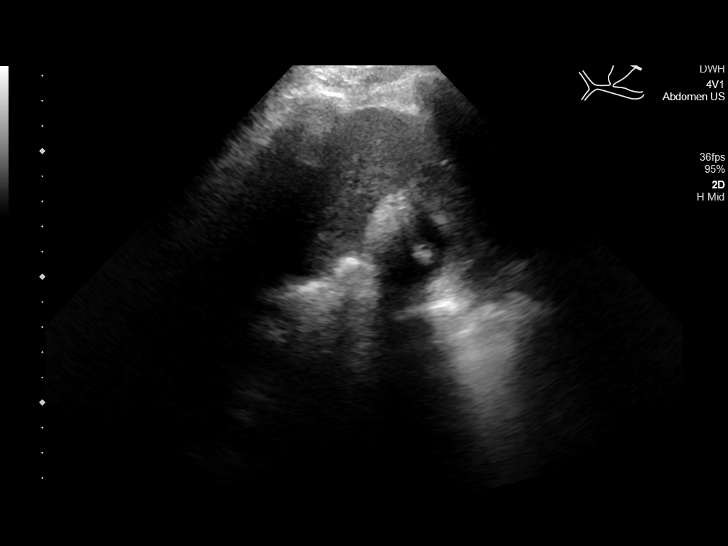
[im 14/41]
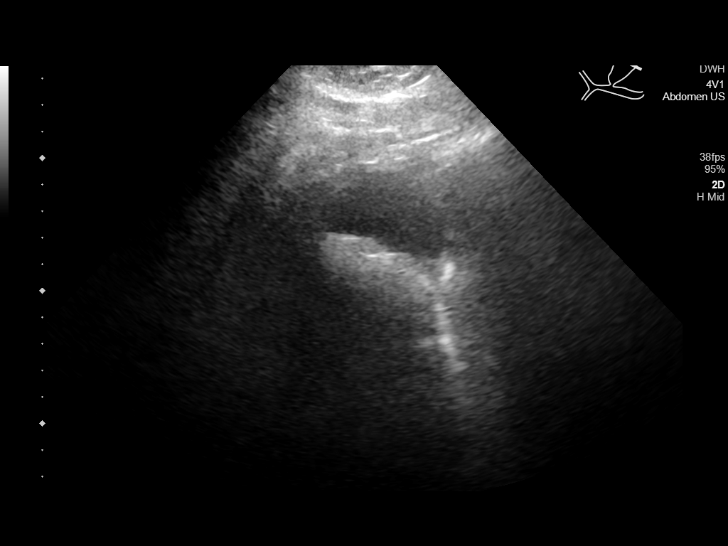
[im 16/41]
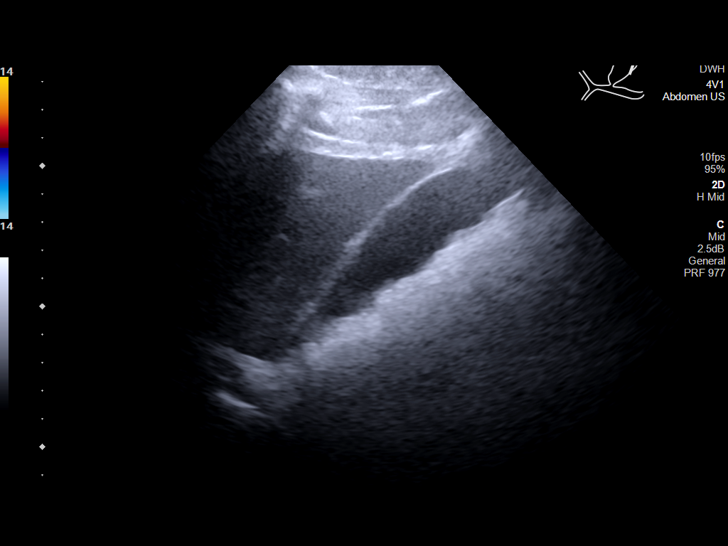
[im 19/41]
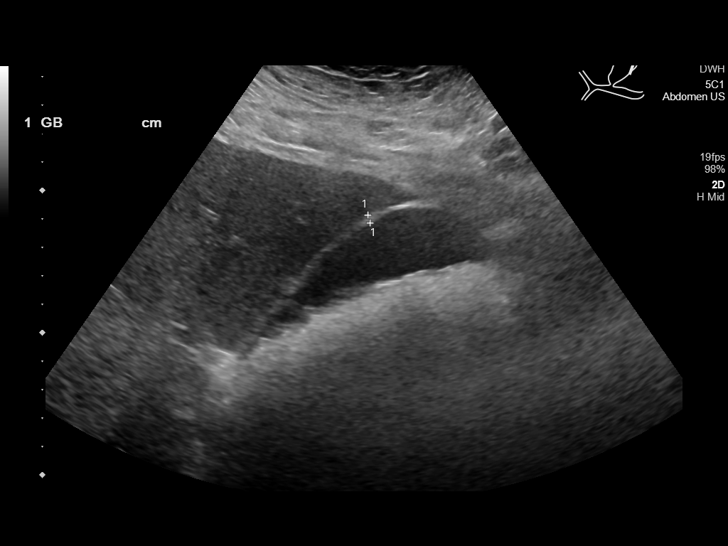
[im 22/41]
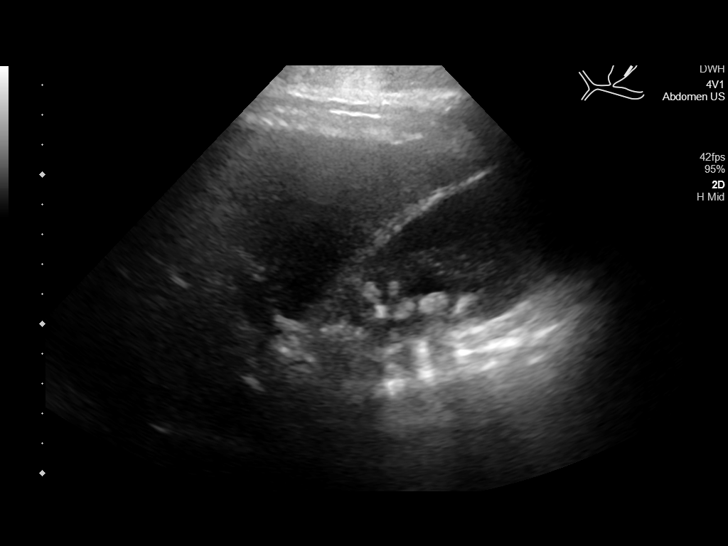
[im 26/41]
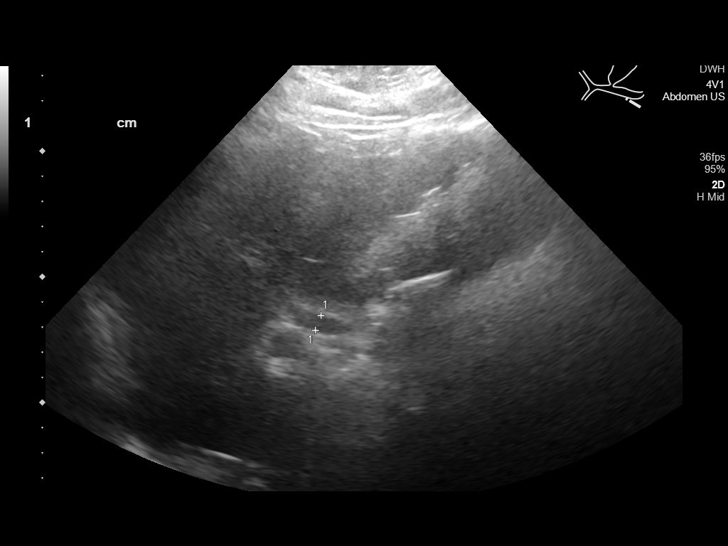
[im 27/41]
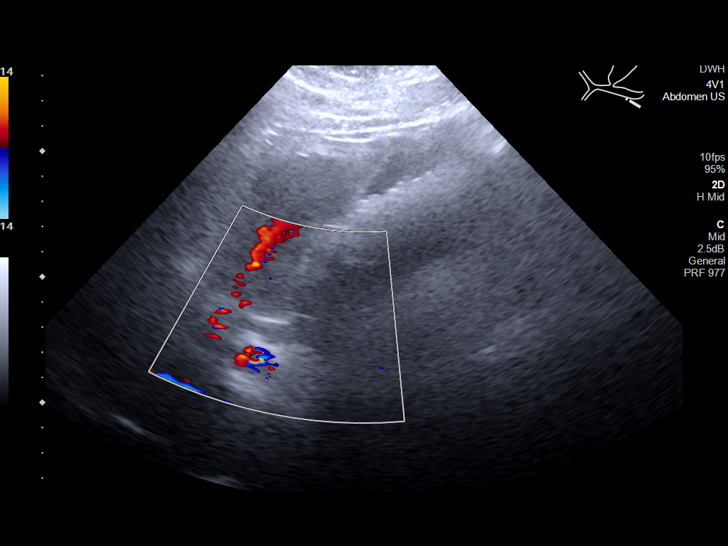
[im 31/41]
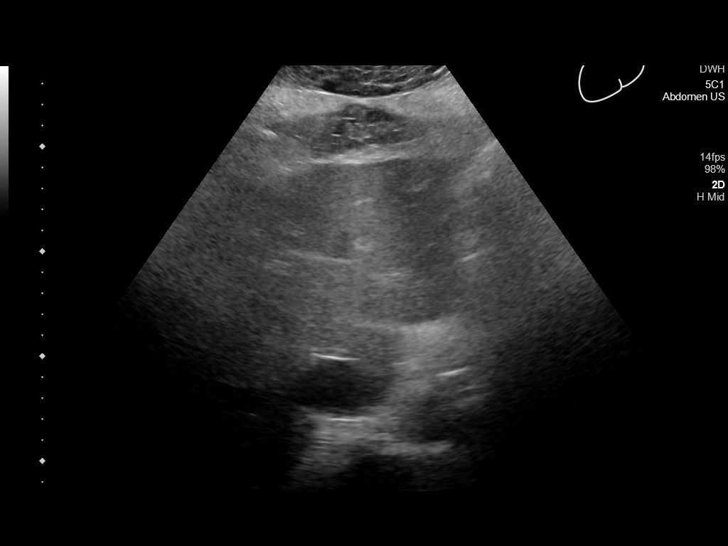
[im 34/41]
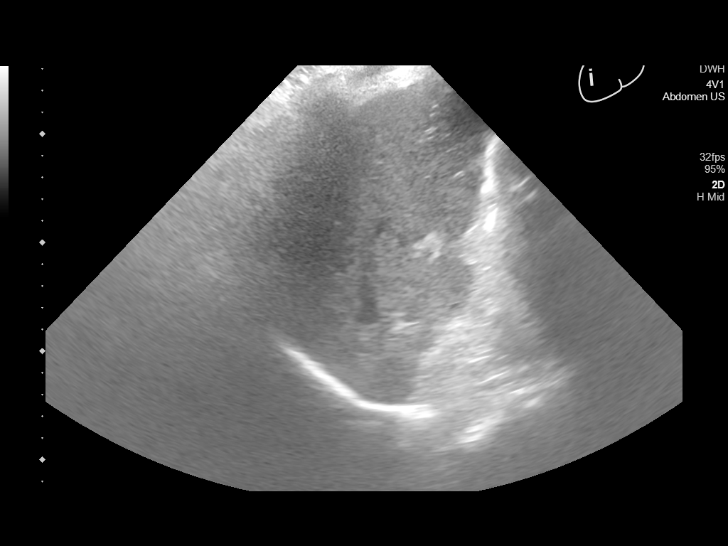
[im 37/41]
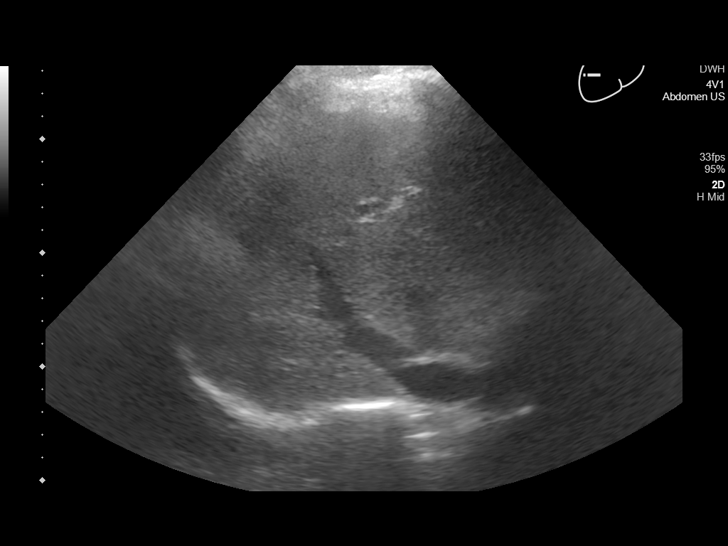
[im 41/41]
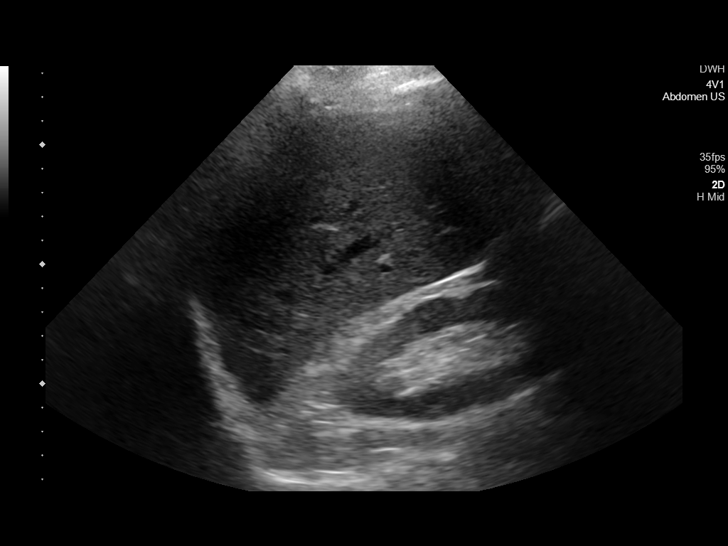

[14 of 25 positions shown; findings below may reference images not displayed]

FINDINGS: Gallbladder:

Numerous layering calcified gallstones and sludge are seen. The
largest measuring 9 mm. No gallbladder wall thickening is noted. No
sonographic Murphy sign noted by sonographer.

Common bile duct:

Diameter: 6 mm

Liver:

No focal lesion identified. Within normal limits in parenchymal
echogenicity. Portal vein is patent on color Doppler imaging with
normal direction of blood flow towards the liver.

Other: None.
IMPRESSION: Cholelithiasis and layering sludge. No definite evidence of acute
cholecystitis.

## 2023-04-08 ENCOUNTER — Encounter: Payer: Self-pay | Admitting: Ophthalmology

## 2023-04-08 NOTE — Anesthesia Preprocedure Evaluation (Addendum)
Anesthesia Evaluation  Patient identified by MRN, date of birth, ID band Patient awake    Reviewed: Allergy & Precautions, H&P , NPO status , Patient's Chart, lab work & pertinent test results  Airway Mallampati: IV  TM Distance: <3 FB Neck ROM: Full    Dental no notable dental hx.  Permanent upper bridge, does not come out:   Pulmonary neg pulmonary ROS   Pulmonary exam normal breath sounds clear to auscultation       Cardiovascular negative cardio ROS Normal cardiovascular exam Rhythm:Regular Rate:Normal     Neuro/Psych negative neurological ROS  negative psych ROS   GI/Hepatic negative GI ROS, Neg liver ROS,,,  Endo/Other  negative endocrine ROS    Renal/GU negative Renal ROS  negative genitourinary   Musculoskeletal negative musculoskeletal ROS (+)    Abdominal   Peds negative pediatric ROS (+)  Hematology negative hematology ROS (+)   Anesthesia Other Findings Hx BCC right ear Hx ulcerative colitis  Anxious, does not want to be aware, discussed cannot guarantee no awareness, but can add more meds if desired, so long as we keep him breathing spontaneously, will monitor and adminsiter  Reproductive/Obstetrics negative OB ROS                             Anesthesia Physical Anesthesia Plan  ASA: 2  Anesthesia Plan: MAC   Post-op Pain Management:    Induction: Intravenous  PONV Risk Score and Plan:   Airway Management Planned: Natural Airway and Nasal Cannula  Additional Equipment:   Intra-op Plan:   Post-operative Plan:   Informed Consent: I have reviewed the patients History and Physical, chart, labs and discussed the procedure including the risks, benefits and alternatives for the proposed anesthesia with the patient or authorized representative who has indicated his/her understanding and acceptance.     Dental Advisory Given  Plan Discussed with: Anesthesiologist,  CRNA and Surgeon  Anesthesia Plan Comments: (Patient consented for risks of anesthesia including but not limited to:  - adverse reactions to medications - damage to eyes, teeth, lips or other oral mucosa - nerve damage due to positioning  - sore throat or hoarseness - Damage to heart, brain, nerves, lungs, other parts of body or loss of life  Patient voiced understanding.)        Anesthesia Quick Evaluation

## 2023-04-09 NOTE — Discharge Instructions (Signed)

## 2023-04-14 ENCOUNTER — Encounter: Payer: Self-pay | Admitting: Ophthalmology

## 2023-04-14 ENCOUNTER — Encounter
Admission: RE | Disposition: A | Discharge status: Home / Self Care | Payer: Self-pay | Source: Home / Self Care | Attending: Ophthalmology

## 2023-04-14 ENCOUNTER — Ambulatory Visit: Payer: Medicare Other | Admitting: Anesthesiology

## 2023-04-14 ENCOUNTER — Ambulatory Visit
Admission: RE | Admit: 2023-04-14 | Discharge: 2023-04-14 | Disposition: A | Discharge status: Home / Self Care | Payer: Medicare Other | Attending: Ophthalmology | Admitting: Ophthalmology

## 2023-04-14 ENCOUNTER — Other Ambulatory Visit: Payer: Self-pay

## 2023-04-14 DIAGNOSIS — H2512 Age-related nuclear cataract, left eye: Secondary | ICD-10-CM | POA: Diagnosis present

## 2023-04-14 HISTORY — DX: Noninfective gastroenteritis and colitis, unspecified: K52.9

## 2023-04-14 HISTORY — PX: CATARACT EXTRACTION W/PHACO: SHX586

## 2023-04-14 HISTORY — DX: Basal cell carcinoma of skin of right ear and external auricular canal: C44.212

## 2023-04-14 HISTORY — DX: Personal history of other diseases of the digestive system: Z87.19

## 2023-04-14 SURGERY — PHACOEMULSIFICATION, CATARACT, WITH IOL INSERTION
Anesthesia: Monitor Anesthesia Care | Laterality: Left

## 2023-04-14 MED ORDER — FENTANYL CITRATE (PF) 100 MCG/2ML IJ SOLN
INTRAMUSCULAR | Status: DC | PRN
  Administered 2023-04-14 (×2): 50 ug via INTRAVENOUS

## 2023-04-14 MED ORDER — LIDOCAINE HCL (PF) 2 % IJ SOLN
INTRAOCULAR | Status: DC | PRN
  Administered 2023-04-14: 1 mL via INTRAOCULAR

## 2023-04-14 MED ORDER — TETRACAINE HCL 0.5 % OP SOLN
1.0000 [drp] | OPHTHALMIC | Status: DC | PRN
  Administered 2023-04-14 (×3): 1 [drp] via OPHTHALMIC

## 2023-04-14 MED ORDER — MIDAZOLAM HCL 2 MG/2ML IJ SOLN
INTRAMUSCULAR | Status: AC
  Filled 2023-04-14: qty 2

## 2023-04-14 MED ORDER — SIGHTPATH DOSE#1 NA HYALUR & NA CHOND-NA HYALUR IO KIT
PACK | INTRAOCULAR | Status: DC | PRN
  Administered 2023-04-14: 1 via OPHTHALMIC

## 2023-04-14 MED ORDER — ARMC OPHTHALMIC DILATING DROPS
OPHTHALMIC | Status: AC
  Filled 2023-04-14: qty 0.5

## 2023-04-14 MED ORDER — FENTANYL CITRATE (PF) 100 MCG/2ML IJ SOLN
INTRAMUSCULAR | Status: AC
  Filled 2023-04-14: qty 2

## 2023-04-14 MED ORDER — TETRACAINE HCL 0.5 % OP SOLN
OPHTHALMIC | Status: AC
  Filled 2023-04-14: qty 4

## 2023-04-14 MED ORDER — LACTATED RINGERS IV SOLN: INTRAVENOUS | Status: DC

## 2023-04-14 MED ORDER — SIGHTPATH DOSE#1 BSS IO SOLN
INTRAOCULAR | Status: DC | PRN
  Administered 2023-04-14: 93 mL via OPHTHALMIC

## 2023-04-14 MED ORDER — SIGHTPATH DOSE#1 BSS IO SOLN
INTRAOCULAR | Status: DC | PRN
  Administered 2023-04-14: 15 mL via INTRAOCULAR

## 2023-04-14 MED ORDER — MOXIFLOXACIN HCL 0.5 % OP SOLN
OPHTHALMIC | Status: DC | PRN
  Administered 2023-04-14: .2 mL via OPHTHALMIC

## 2023-04-14 MED ORDER — MIDAZOLAM HCL 2 MG/2ML IJ SOLN
INTRAMUSCULAR | Status: DC | PRN
  Administered 2023-04-14 (×2): 1 mg via INTRAVENOUS

## 2023-04-14 MED ORDER — ARMC OPHTHALMIC DILATING DROPS
1.0000 | OPHTHALMIC | Status: DC | PRN
  Administered 2023-04-14 (×3): 1 via OPHTHALMIC

## 2023-04-14 SURGICAL SUPPLY — 11 items
CATARACT SUITE SIGHTPATH (MISCELLANEOUS) ×1
DISSECTOR HYDRO NUCLEUS 50X22 (MISCELLANEOUS) ×1 IMPLANT
FEE CATARACT SUITE SIGHTPATH (MISCELLANEOUS) ×1 IMPLANT
GLOVE SURG GAMMEX PI TX LF 7.5 (GLOVE) ×1 IMPLANT
GLOVE SURG SYN 8.5 E (GLOVE) ×1
GLOVE SURG SYN 8.5 PF PI (GLOVE) ×1 IMPLANT
LENS IOL TECNIS EYHANCE 16.5 (Intraocular Lens) IMPLANT
NDL FILTER BLUNT 18X1 1/2 (NEEDLE) ×1 IMPLANT
NEEDLE FILTER BLUNT 18X1 1/2 (NEEDLE) ×1
SYR 3ML LL SCALE MARK (SYRINGE) ×1 IMPLANT
SYR 5ML LL (SYRINGE) ×1 IMPLANT

## 2023-04-14 NOTE — Transfer of Care (Signed)
Immediate Anesthesia Transfer of Care Note  Patient: Joseph David  Procedure(s) Performed: CATARACT EXTRACTION PHACO AND INTRAOCULAR LENS PLACEMENT (IOC) LEFT 5.70 00:32.8 (Left)  Patient Location: PACU  Anesthesia Type: MAC  Level of Consciousness: awake, alert  and patient cooperative  Airway and Oxygen Therapy: Patient Spontanous Breathing and Patient connected to supplemental oxygen  Post-op Assessment: Post-op Vital signs reviewed, Patient's Cardiovascular Status Stable, Respiratory Function Stable, Patent Airway and No signs of Nausea or vomiting  Post-op Vital Signs: Reviewed and stable  Complications: No notable events documented.

## 2023-04-14 NOTE — Op Note (Signed)
OPERATIVE NOTE  Joseph David [id number] 04/14/2023   PREOPERATIVE DIAGNOSIS:  Nuclear sclerotic cataract left eye.  H25.12   POSTOPERATIVE DIAGNOSIS:    Nuclear sclerotic cataract left eye.     PROCEDURE:  Phacoemusification with posterior chamber intraocular lens placement of the left eye   LENS:   Implant Name Type Inv. Item Serial No. Manufacturer Lot No. LRB No. Used Action  LENS IOL TECNIS EYHANCE 16.5 - [id number] Intraocular Lens LENS IOL TECNIS EYHANCE 16.5 [id number] [employer]  Left 1 Implanted      Procedure(s): CATARACT EXTRACTION PHACO AND INTRAOCULAR LENS PLACEMENT (IOC) LEFT 5.70 00:32.8 (Left)  SURGEON:  Willey Blade, MD, MPH   ANESTHESIA:  Topical with tetracaine drops augmented with 1% preservative-free intracameral lidocaine.  ESTIMATED BLOOD LOSS: <1 mL   COMPLICATIONS:  None.   DESCRIPTION OF PROCEDURE:  The patient was identified in the holding room and transported to the operating room and placed in the supine position under the operating microscope.  The left eye was identified as the operative eye and it was prepped and draped in the usual sterile ophthalmic fashion.   A 1.0 millimeter clear-corneal paracentesis was made at the 5:00 position. 0.5 ml of preservative-free 1% lidocaine with epinephrine was injected into the anterior chamber.  The anterior chamber was filled with viscoelastic.  A 2.4 millimeter keratome was used to make a near-clear corneal incision at the 2:00 position.  A curvilinear capsulorrhexis was made with a cystotome and capsulorrhexis forceps.  Balanced salt solution was used to hydrodissect and hydrodelineate the nucleus.   Phacoemulsification was then used in stop and chop fashion to remove the lens nucleus and epinucleus.  The remaining cortex was then removed using the irrigation and aspiration handpiece. Viscoelastic was then placed into the capsular bag to distend it for lens placement.  A lens was then injected into the capsular  bag.  The remaining viscoelastic was aspirated.   Wounds were hydrated with balanced salt solution.  The anterior chamber was inflated to a physiologic pressure with balanced salt solution.  Intracameral vigamox 0.1 mL undiltued was injected into the eye and a drop placed onto the ocular surface.  No wound leaks were noted.  The patient was taken to the recovery room in stable condition without complications of anesthesia or surgery  Willey Blade 04/14/2023, 9:19 AM

## 2023-04-14 NOTE — Anesthesia Postprocedure Evaluation (Signed)
Anesthesia Post Note  Patient: JOVI ZAVADIL  Procedure(s) Performed: CATARACT EXTRACTION PHACO AND INTRAOCULAR LENS PLACEMENT (IOC) LEFT 5.70 00:32.8 (Left)  Patient location during evaluation: PACU Anesthesia Type: MAC Level of consciousness: awake and alert Pain management: pain level controlled Vital Signs Assessment: post-procedure vital signs reviewed and stable Respiratory status: spontaneous breathing, nonlabored ventilation, respiratory function stable and patient connected to nasal cannula oxygen Cardiovascular status: stable and blood pressure returned to baseline Postop Assessment: no apparent nausea or vomiting Anesthetic complications: no   No notable events documented.   Last Vitals:  Vitals:   04/14/23 0827 04/14/23 0920  BP: (!) 143/78 125/84  Pulse:  (!) 12  Resp: 14 12  Temp: 36.5 C 36.5 C  SpO2: 95% 96%    Last Pain:  Vitals:   04/14/23 0920  TempSrc: Temporal  PainSc: 0-No pain                 Cozy Veale C Ambyr Qadri

## 2023-04-14 NOTE — H&P (Signed)
[facility]   Primary Care Physician:  Jerl Mina, MD Ophthalmologist: Dr. Willey Blade  Pre-Procedure History & Physical: HPI:  Joseph David is a 72 y.o. male here for cataract surgery.   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 Authorizing Provider  betamethasone dipropionate (DIPROLENE) 0.05 % ointment Apply topically as needed.   Yes [provider]  loratadine (CLARITIN) 10 MG tablet Take 10 mg by mouth daily as needed for allergies.   Yes [provider]  mesalamine (LIALDA) 1.2 g EC tablet Take 2.4 g by mouth daily with breakfast.   Yes [provider]    Allergies as of 03/24/2023   (No Known Allergies)    History reviewed. No pertinent family history.  Social History   Socioeconomic History   Marital status: Married    Spouse name: Not on file   Number of children: Not on file   Years of education: Not on file   Highest education level: Not on file  Occupational History   Not on file  Tobacco Use   Smoking status: Never   Smokeless tobacco: Never  Vaping Use   Vaping status: Never Used  Substance and Sexual Activity   Alcohol use: Yes    Alcohol/week: 8.0 standard drinks of alcohol    Types: 8 Cans of beer per week    Comment: occas   Drug use: Never   Sexual activity: Not on file  Other Topics Concern   Not on file  Social History Narrative   Not on file   Social Determinants of Health   Financial Resource Strain: Low Risk  (10/31/2022)   Received from [facility]   Overall Financial Resource Strain (CARDIA)    Difficulty of Paying Living Expenses: Not hard at all  Food Insecurity: No Food Insecurity (10/31/2022)   Received from [facility]   Hunger Vital Sign    Worried About Running Out of Food in the Last Year: Never true    Ran Out of Food in the Last Year: Never true  Transportation Needs: No Transportation Needs (10/31/2022)   Received from [facility] - Transportation    In the past 12 months, has lack of transportation kept you from medical appointments or from getting medications?: No    Lack of Transportation (Non-Medical): No  Physical Activity: Not on file  Stress: Not on file  Social Connections: Not on file  Intimate Partner Violence: Not on file    Review of Systems: See HPI, otherwise negative ROS  Physical Exam: BP (!) 143/78   Temp 97.7 F (36.5 C) (Temporal)   Resp 14   Ht 6\' 1"  (1.854 m)   Wt 117.9 kg   SpO2 95%   BMI 34.29 kg/m  General:   Alert, cooperative in NAD Head:  Normocephalic and atraumatic. Respiratory:  Normal work of breathing. Cardiovascular:  RRR  Impression/Plan: Joseph David is here for cataract surgery.  Risks, benefits, limitations, and alternatives regarding cataract surgery have been reviewed with the patient.  Questions have been answered.  All parties agreeable.   Willey Blade, MD  04/14/2023, 8:55 AM

## 2023-04-21 NOTE — Anesthesia Preprocedure Evaluation (Addendum)
Anesthesia Evaluation  Patient identified by MRN, date of birth, ID band Patient awake    Reviewed: Allergy & Precautions, H&P , NPO status , Patient's Chart, lab work & pertinent test results  Airway Mallampati: IV  TM Distance: <3 FB Neck ROM: Full    Dental no notable dental hx.    Permanent upper bridge, does not come out:  :   Pulmonary neg pulmonary ROS   Pulmonary exam normal breath sounds clear to auscultation       Cardiovascular negative cardio ROS Normal cardiovascular exam Rhythm:Regular Rate:Normal     Neuro/Psych negative neurological ROS  negative psych ROS   GI/Hepatic negative GI ROS, Neg liver ROS,,,  Endo/Other  negative endocrine ROS    Renal/GU negative Renal ROS  negative genitourinary   Musculoskeletal negative musculoskeletal ROS (+)    Abdominal   Peds negative pediatric ROS (+)  Hematology negative hematology ROS (+)   Anesthesia Other Findings History of ulcerative colitis 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 Juel Burrow anesthesologist   Anxious, does not want to be aware, previously discussed cannot guarantee no awareness, but can add more meds if desired, so long as we keep him breathing spontaneously, will monitor and administer, second time cataract may need more sedation to achieve same effect, will ask patient to notify CRNA if anxious   Reproductive/Obstetrics negative OB ROS                             Anesthesia Physical Anesthesia Plan  ASA: 2  Anesthesia Plan: MAC   Post-op Pain Management:    Induction: Intravenous  PONV Risk Score and Plan:   Airway Management Planned: Natural Airway and Nasal Cannula  Additional Equipment:   Intra-op Plan:   Post-operative Plan:   Informed Consent: I have reviewed the patients History and Physical, chart, labs and discussed the procedure  including the risks, benefits and alternatives for the proposed anesthesia with the patient or authorized representative who has indicated his/her understanding and acceptance.     Dental Advisory Given  Plan Discussed with: Anesthesiologist, CRNA and Surgeon  Anesthesia Plan Comments: (Patient consented for risks of anesthesia including but not limited to:  - adverse reactions to medications - damage to eyes, teeth, lips or other oral mucosa - nerve damage due to positioning  - sore throat or hoarseness - Damage to heart, brain, nerves, lungs, other parts of body or loss of life  Patient voiced understanding.)        Anesthesia Quick Evaluation

## 2023-04-25 NOTE — Discharge Instructions (Signed)

## 2023-04-28 ENCOUNTER — Ambulatory Visit: Payer: Medicare Other | Admitting: Anesthesiology

## 2023-04-28 ENCOUNTER — Encounter: Payer: Self-pay | Admitting: Ophthalmology

## 2023-04-28 ENCOUNTER — Other Ambulatory Visit: Payer: Self-pay

## 2023-04-28 ENCOUNTER — Encounter
Admission: RE | Disposition: A | Discharge status: Home / Self Care | Payer: Self-pay | Source: Home / Self Care | Attending: Ophthalmology

## 2023-04-28 ENCOUNTER — Ambulatory Visit
Admission: RE | Admit: 2023-04-28 | Discharge: 2023-04-28 | Disposition: A | Discharge status: Home / Self Care | Payer: Medicare Other | Attending: Ophthalmology | Admitting: Ophthalmology

## 2023-04-28 DIAGNOSIS — H2511 Age-related nuclear cataract, right eye: Secondary | ICD-10-CM | POA: Diagnosis present

## 2023-04-28 HISTORY — PX: CATARACT EXTRACTION W/PHACO: SHX586

## 2023-04-28 SURGERY — PHACOEMULSIFICATION, CATARACT, WITH IOL INSERTION
Anesthesia: Monitor Anesthesia Care | Laterality: Right

## 2023-04-28 MED ORDER — FENTANYL CITRATE (PF) 100 MCG/2ML IJ SOLN
INTRAMUSCULAR | Status: DC | PRN
  Administered 2023-04-28 (×2): 50 ug via INTRAVENOUS

## 2023-04-28 MED ORDER — SODIUM CHLORIDE 0.9% FLUSH: 10.0000 mL | INTRAVENOUS | Status: DC | PRN

## 2023-04-28 MED ORDER — TETRACAINE HCL 0.5 % OP SOLN
1.0000 [drp] | OPHTHALMIC | Status: DC | PRN
  Administered 2023-04-28 (×3): 1 [drp] via OPHTHALMIC

## 2023-04-28 MED ORDER — SIGHTPATH DOSE#1 NA HYALUR & NA CHOND-NA HYALUR IO KIT
PACK | INTRAOCULAR | Status: DC | PRN
  Administered 2023-04-28: 1 via OPHTHALMIC

## 2023-04-28 MED ORDER — SIGHTPATH DOSE#1 BSS IO SOLN
INTRAOCULAR | Status: DC | PRN
  Administered 2023-04-28: 15 mL via INTRAOCULAR

## 2023-04-28 MED ORDER — SIGHTPATH DOSE#1 BSS IO SOLN
INTRAOCULAR | Status: DC | PRN
  Administered 2023-04-28: 106 mL via OPHTHALMIC

## 2023-04-28 MED ORDER — ARMC OPHTHALMIC DILATING DROPS
OPHTHALMIC | Status: AC
  Filled 2023-04-28: qty 0.5

## 2023-04-28 MED ORDER — TETRACAINE HCL 0.5 % OP SOLN
OPHTHALMIC | Status: AC
  Filled 2023-04-28: qty 4

## 2023-04-28 MED ORDER — ARMC OPHTHALMIC DILATING DROPS
1.0000 | OPHTHALMIC | Status: DC | PRN
  Administered 2023-04-28 (×3): 1 via OPHTHALMIC

## 2023-04-28 MED ORDER — FENTANYL CITRATE (PF) 100 MCG/2ML IJ SOLN
INTRAMUSCULAR | Status: AC
  Filled 2023-04-28: qty 2

## 2023-04-28 MED ORDER — MOXIFLOXACIN HCL 0.5 % OP SOLN
OPHTHALMIC | Status: DC | PRN
  Administered 2023-04-28: .2 mL via OPHTHALMIC

## 2023-04-28 MED ORDER — MIDAZOLAM HCL 2 MG/2ML IJ SOLN
INTRAMUSCULAR | Status: DC | PRN
  Administered 2023-04-28: 2 mg via INTRAVENOUS

## 2023-04-28 MED ORDER — LIDOCAINE HCL (PF) 2 % IJ SOLN
INTRAOCULAR | Status: DC | PRN
  Administered 2023-04-28: 1 mL via INTRAOCULAR

## 2023-04-28 MED ORDER — LACTATED RINGERS IV SOLN: INTRAVENOUS | Status: DC

## 2023-04-28 MED ORDER — MIDAZOLAM HCL 2 MG/2ML IJ SOLN
INTRAMUSCULAR | Status: AC
  Filled 2023-04-28: qty 2

## 2023-04-28 SURGICAL SUPPLY — 11 items
CATARACT SUITE SIGHTPATH (MISCELLANEOUS) ×1
DISSECTOR HYDRO NUCLEUS 50X22 (MISCELLANEOUS) ×1 IMPLANT
FEE CATARACT SUITE SIGHTPATH (MISCELLANEOUS) ×1 IMPLANT
GLOVE SURG GAMMEX PI TX LF 7.5 (GLOVE) ×1 IMPLANT
GLOVE SURG SYN 8.5 E (GLOVE) ×1
GLOVE SURG SYN 8.5 PF PI (GLOVE) ×1 IMPLANT
LENS IOL TECNIS EYHANCE 18.0 (Intraocular Lens) IMPLANT
NDL FILTER BLUNT 18X1 1/2 (NEEDLE) ×1 IMPLANT
NEEDLE FILTER BLUNT 18X1 1/2 (NEEDLE) ×1
SYR 3ML LL SCALE MARK (SYRINGE) ×1 IMPLANT
SYR 5ML LL (SYRINGE) ×1 IMPLANT

## 2023-04-28 NOTE — H&P (Signed)
[facility]   Primary Care Physician:  Jerl Mina, MD Ophthalmologist: Dr. Willey Blade  Pre-Procedure History & Physical: HPI:  Joseph David is a 72 y.o. male here for cataract surgery.   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 Authorizing Provider  loratadine (CLARITIN) 10 MG tablet Take 10 mg by mouth daily as needed for allergies.   Yes [provider]  mesalamine (LIALDA) 1.2 g EC tablet Take 2.4 g by mouth daily with breakfast.   Yes [provider]  betamethasone dipropionate (DIPROLENE) 0.05 % ointment Apply topically as needed.    [provider]    Allergies as of 03/24/2023   (No Known Allergies)    History reviewed. No pertinent family history.  Social History   Socioeconomic History   Marital status: Married    Spouse name: Not on file   Number of children: Not on file   Years of education: Not on file   Highest education level: Not on file  Occupational History   Not on file  Tobacco Use   Smoking status: Never   Smokeless tobacco: Never  Vaping Use   Vaping status: Never Used  Substance and Sexual Activity   Alcohol use: Yes    Alcohol/week: 8.0 standard drinks of alcohol    Types: 8 Cans of beer per week    Comment: occas   Drug use: Never   Sexual activity: Not on file  Other Topics Concern   Not on file  Social History Narrative   Not on file   Social Determinants of Health   Financial Resource  Strain: Low Risk  (10/31/2022)   Received from [facility]   Overall Financial Resource Strain (CARDIA)    Difficulty of Paying Living Expenses: Not hard at all  Food Insecurity: No Food Insecurity (10/31/2022)   Received from [facility]   Hunger Vital Sign    Worried About Running Out of Food in the Last Year: Never true    Ran Out of Food in the Last Year: Never true  Transportation Needs: No Transportation Needs (10/31/2022)   Received from [facility] - Transportation    In the past 12 months, has lack of transportation kept you from medical appointments or from getting medications?: No    Lack of Transportation (Non-Medical): No  Physical Activity: Not on file  Stress: Not on file  Social Connections: Not on file  Intimate Partner Violence: Not on file    Review of Systems: See HPI, otherwise negative ROS  Physical Exam: BP 136/77   Pulse 63   Temp 98.2 F (36.8 C) (Temporal)   Ht 6' 0.99" (1.854 m)   Wt 117 kg   SpO2 96%   BMI 34.05 kg/m  General:   Alert, cooperative in NAD Head:  Normocephalic and atraumatic. Respiratory:  Normal work of breathing. Cardiovascular:  RRR  Impression/Plan: Joseph David is here for cataract  surgery.  Risks, benefits, limitations, and alternatives regarding cataract surgery have been reviewed with the patient.  Questions have been answered.  All parties agreeable.   Willey Blade, MD  04/28/2023, 11:48 AM

## 2023-04-28 NOTE — Anesthesia Postprocedure Evaluation (Signed)
Anesthesia Post Note  Patient: Joseph David  Procedure(s) Performed: CATARACT EXTRACTION PHACO AND INTRAOCULAR LENS PLACEMENT (IOC) RIGHT 5.46 00:42.1 (Right)  Patient location during evaluation: PACU Anesthesia Type: MAC Level of consciousness: awake and alert Pain management: pain level controlled Vital Signs Assessment: post-procedure vital signs reviewed and stable Respiratory status: spontaneous breathing, nonlabored ventilation, respiratory function stable and patient connected to nasal cannula oxygen Cardiovascular status: stable and blood pressure returned to baseline Postop Assessment: no apparent nausea or vomiting Anesthetic complications: no   No notable events documented.   Last Vitals:  Vitals:   04/28/23 1215 04/28/23 1220  BP: 136/76 134/80  Pulse: 61 61  Resp: 14 17  Temp: 36.5 C 36.5 C  SpO2: 95% 98%    Last Pain:  Vitals:   04/28/23 1215  TempSrc:   PainSc: 0-No pain                 Ami Mally C Terrick Allred

## 2023-04-28 NOTE — Transfer of Care (Signed)
Immediate Anesthesia Transfer of Care Note  Patient: Joseph David  Procedure(s) Performed: CATARACT EXTRACTION PHACO AND INTRAOCULAR LENS PLACEMENT (IOC) RIGHT 5.46 00:42.1 (Right)  Patient Location: PACU  Anesthesia Type: MAC  Level of Consciousness: awake, alert  and patient cooperative  Airway and Oxygen Therapy: Patient Spontanous Breathing and Patient connected to supplemental oxygen  Post-op Assessment: Post-op Vital signs reviewed, Patient's Cardiovascular Status Stable, Respiratory Function Stable, Patent Airway and No signs of Nausea or vomiting  Post-op Vital Signs: Reviewed and stable  Complications: No notable events documented.

## 2023-04-28 NOTE — Op Note (Signed)
OPERATIVE NOTE  Joseph David [id number] 04/28/2023   PREOPERATIVE DIAGNOSIS:  Nuclear sclerotic cataract right eye.  H25.11   POSTOPERATIVE DIAGNOSIS:    Nuclear sclerotic cataract right eye.     PROCEDURE:  Phacoemusification with posterior chamber intraocular lens placement of the right eye   LENS:   Implant Name Type Inv. Item Serial No. Manufacturer Lot No. LRB No. Used Action  LENS IOL TECNIS EYHANCE 18.0 - [id number] Intraocular Lens LENS IOL TECNIS EYHANCE 18.0 [id number] [employer]  Right 1 Implanted       Procedure(s): CATARACT EXTRACTION PHACO AND INTRAOCULAR LENS PLACEMENT (IOC) RIGHT 5.46 00:42.1 (Right)  SURGEON:  Willey Blade, MD, MPH  ANESTHESIOLOGIST: Anesthesiologist: Marisue Humble, MD CRNA: Bynum, [place], CRNA   ANESTHESIA:  Topical with tetracaine drops augmented with 1% preservative-free intracameral lidocaine.  ESTIMATED BLOOD LOSS: less than 1 mL.   COMPLICATIONS:  None.   DESCRIPTION OF PROCEDURE:  The patient was identified in the holding room and transported to the operating room and placed in the supine position under the operating microscope.  The right eye was identified as the operative eye and it was prepped and draped in the usual sterile ophthalmic fashion.   A 1.0 millimeter clear-corneal paracentesis was made at the 10:30 position. 0.5 ml of preservative-free 1% lidocaine with epinephrine was injected into the anterior chamber.  The anterior chamber was filled with viscoelastic.  A 2.4 millimeter keratome was used to make a near-clear corneal incision at the 8:00 position.  A curvilinear capsulorrhexis was made with a cystotome and capsulorrhexis forceps.  Balanced salt solution was used to hydrodissect and hydrodelineate the nucleus.   Phacoemulsification was then used in stop and chop fashion to remove the lens nucleus and epinucleus.  The remaining cortex was then removed using the irrigation and aspiration handpiece. Viscoelastic was then  placed into the capsular bag to distend it for lens placement.  A lens was then injected into the capsular bag.  The remaining viscoelastic was aspirated.   Wounds were hydrated with balanced salt solution.  The anterior chamber was inflated to a physiologic pressure with balanced salt solution.   Intracameral vigamox 0.1 mL undiluted was injected into the eye and a drop placed onto the ocular surface.  No wound leaks were noted.  The patient was taken to the recovery room in stable condition without complications of anesthesia or surgery  Willey Blade 04/28/2023, 12:14 PM

## 2023-04-29 ENCOUNTER — Encounter: Payer: Self-pay | Admitting: Ophthalmology

## 2023-09-22 ENCOUNTER — Ambulatory Visit: Payer: Self-pay

## 2023-09-22 DIAGNOSIS — Z8719 Personal history of other diseases of the digestive system: Secondary | ICD-10-CM | POA: Diagnosis not present

## 2023-09-22 DIAGNOSIS — K640 First degree hemorrhoids: Secondary | ICD-10-CM | POA: Diagnosis not present

## 2023-09-22 DIAGNOSIS — Z1211 Encounter for screening for malignant neoplasm of colon: Secondary | ICD-10-CM | POA: Diagnosis present

## 2023-09-22 DIAGNOSIS — K5730 Diverticulosis of large intestine without perforation or abscess without bleeding: Secondary | ICD-10-CM | POA: Diagnosis not present

## 2023-09-22 DIAGNOSIS — Z860101 Personal history of adenomatous and serrated colon polyps: Secondary | ICD-10-CM | POA: Diagnosis not present
